# Patient Record
Sex: Female | Born: 1960 | Race: White | Hispanic: No | Marital: Single | Smoking: Former smoker
Health system: Southern US, Community
[De-identification: ages and names within clinical notes are randomized; demographics above are authoritative.]

## PROBLEM LIST (undated history)

## (undated) DIAGNOSIS — K219 Gastro-esophageal reflux disease without esophagitis: Secondary | ICD-10-CM

## (undated) DIAGNOSIS — Z87891 Personal history of nicotine dependence: Secondary | ICD-10-CM

## (undated) DIAGNOSIS — E079 Disorder of thyroid, unspecified: Secondary | ICD-10-CM

## (undated) HISTORY — PX: NECK SURGERY: SHX720

## (undated) HISTORY — PX: FOOT SURGERY: SHX648

## (undated) HISTORY — DX: Personal history of nicotine dependence: Z87.891

## (undated) HISTORY — DX: Gastro-esophageal reflux disease without esophagitis: K21.9

## (undated) HISTORY — PX: BLADDER SURGERY: SHX569

## (undated) HISTORY — PX: NASAL SINUS SURGERY: SHX719

## (undated) HISTORY — PX: ABDOMINAL HYSTERECTOMY: SHX81

---

## 2014-01-28 ENCOUNTER — Emergency Department (HOSPITAL_BASED_OUTPATIENT_CLINIC_OR_DEPARTMENT_OTHER)
Admission: EM | Admit: 2014-01-28 | Discharge: 2014-01-28 | Disposition: A | Discharge status: Home / Self Care | Payer: Self-pay | Attending: Emergency Medicine | Admitting: Emergency Medicine

## 2014-01-28 ENCOUNTER — Encounter (HOSPITAL_BASED_OUTPATIENT_CLINIC_OR_DEPARTMENT_OTHER): Payer: Self-pay | Admitting: Emergency Medicine

## 2014-01-28 DIAGNOSIS — J309 Allergic rhinitis, unspecified: Secondary | ICD-10-CM | POA: Insufficient documentation

## 2014-01-28 DIAGNOSIS — Z79899 Other long term (current) drug therapy: Secondary | ICD-10-CM | POA: Insufficient documentation

## 2014-01-28 DIAGNOSIS — J302 Other seasonal allergic rhinitis: Secondary | ICD-10-CM

## 2014-01-28 DIAGNOSIS — M791 Myalgia, unspecified site: Secondary | ICD-10-CM

## 2014-01-28 DIAGNOSIS — IMO0001 Reserved for inherently not codable concepts without codable children: Secondary | ICD-10-CM | POA: Insufficient documentation

## 2014-01-28 DIAGNOSIS — E039 Hypothyroidism, unspecified: Secondary | ICD-10-CM | POA: Insufficient documentation

## 2014-01-28 DIAGNOSIS — R52 Pain, unspecified: Secondary | ICD-10-CM | POA: Insufficient documentation

## 2014-01-28 DIAGNOSIS — Z880 Allergy status to penicillin: Secondary | ICD-10-CM | POA: Insufficient documentation

## 2014-01-28 DIAGNOSIS — R51 Headache: Secondary | ICD-10-CM | POA: Insufficient documentation

## 2014-01-28 DIAGNOSIS — Z87891 Personal history of nicotine dependence: Secondary | ICD-10-CM | POA: Insufficient documentation

## 2014-01-28 HISTORY — DX: Disorder of thyroid, unspecified: E07.9

## 2014-01-28 MED ORDER — CETIRIZINE HCL 10 MG PO TABS: 10.0000 mg | ORAL_TABLET | Freq: Every day | ORAL | Status: DC

## 2014-01-28 MED ORDER — CYCLOBENZAPRINE HCL 10 MG PO TABS: 10.0000 mg | ORAL_TABLET | Freq: Two times a day (BID) | ORAL | Status: DC | PRN

## 2014-01-28 MED ORDER — PREDNISONE 20 MG PO TABS: 40.0000 mg | ORAL_TABLET | Freq: Every day | ORAL | Status: DC

## 2014-01-28 MED ORDER — TRAZODONE HCL 50 MG PO TABS: 50.0000 mg | ORAL_TABLET | Freq: Every day | ORAL | Status: DC

## 2014-01-28 NOTE — ED Notes (Signed)
Patient reports that she developed general body aches with hot flashes and decreased sleep for the past several days. Complains of headache with same, reports that she has not had her thyroid meds for greater than a month with nausea.

## 2014-01-28 NOTE — Discharge Instructions (Signed)
Take Flexeril and prednisone as directed for your pain. Follow up with a primary care provider from the resource guide. Take zyrtec for your allergies. Refer to attached documents for more information.    Emergency Department Resource Guide 1) Find a Doctor and Pay Out of Pocket Although you won't have to find out who is covered by your insurance plan, it is a good idea to ask around and get recommendations. You will then need to call the office and see if the doctor you have chosen will accept you as a new patient and what types of options they offer for patients who are self-pay. Some doctors offer discounts or will set up payment plans for their patients who do not have insurance, but you will need to ask so you aren't surprised when you get to your appointment.  2) Contact Your Local Health Department Not all health departments have doctors that can see patients for sick visits, but many do, so it is worth a call to see if yours does. If you don't know where your local health department is, you can check in your phone book. The CDC also has a tool to help you locate your state's health department, and many state websites also have listings of all of their local health departments.  3) Find a Walk-in Clinic If your illness is not likely to be very severe or complicated, you may want to try a walk in clinic. These are popping up all over the country in pharmacies, drugstores, and shopping centers. They're usually staffed by nurse practitioners or physician assistants that have been trained to treat common illnesses and complaints. They're usually fairly quick and inexpensive. However, if you have serious medical issues or chronic medical problems, these are probably not your best option.  No Primary Care Doctor: - Call Health Connect at  [phone] - they can help you locate a primary care doctor that  accepts your insurance, provides certain services, etc. - Physician Referral Service-  [phone]  Chronic Pain Problems: Organization         Address  Phone   Notes  [facility]  [phone] Patients need to be referred by their primary care doctor.   Medication Assistance: Organization         Address  Phone   Notes  [facility] Medication [facility] [address] [place] [place], [place] [address] [phone] --Must be a resident of [facility] -- Must have NO insurance coverage whatsoever (no Medicaid/ Medicare, etc.) -- The pt. MUST have a primary care doctor that directs their care regularly and follows them in the community   MedAssist  [phone]   [employer]  [phone]    Agencies that provide inexpensive medical care: Organization         Address  Phone   Notes  [facility]  [phone]   [facility]    [phone]   [facility] [address] [place], [place] [address] [phone]   [facility] [address] [place] [place] [phone]   Planned Parenthood    [phone]   [facility]    [phone]   [facility]  [address], [place] Phone:  [phone], Fax:  [phone] Hours of Operation:  9 am - 6 pm, M-F.  Also accepts Medicaid/Medicare and self-pay.  [facility]  [place], [address], [place] Phone: [phone], Fax: [phone]. Hours of Operation:  8:30 am - 5:30 pm, M-F.  Also accepts Medicaid and self-pay.  [facility] [address], [place] Phone: [phone]   [place], [place], [place] [phone], Ext. [phone] Mondays & Thursdays: 7-9 AM.  First 15 patients are seen on a first come, first serve basis.    [place] Providers:  Organization         Address  Phone   Notes  [facility] [address],  [place] [phone] Also accepts self-pay patients.  [facility] [address], [place]  [phone]   [place], [address], [place] [phone]   [facility] [address] [place] [phone]   Lucianne Lei [address], [place]   [phone] Only accepts [place] Access [place] patients after they have their name applied to their card.   Self-Pay (no insurance) in [facility]:  Organization         Address  Phone   Notes  Sickle Cell Patients, [facility] Internal Medicine [place] [phone]   [facility] [place] [phone]   [facility] [place]  [place], [place], [place] [phone]   [facility]/Dr. Osei-Bonsu  [address], [place] or [address], [place] [phone] Phone number for both [place] and [place] locations is the same.  [facility] [address], [place] [phone]   [facility] [address] [place] or [address] [phone]   [facility] [address] [place] [phone], phone; [phone], fax Sees patients 1st and 3rd Saturday of every month.  Must not qualify for public or private insurance (i.e. Medicaid, Medicare, [place] Health Choice, Veterans' Benefits)  Household income should be no more than 200% of the poverty level The clinic cannot treat you if you are pregnant or think you are pregnant  Sexually transmitted diseases are not treated at the clinic.    Dental Care: Organization         Address  Phone  Notes  [facility] Department of [facility] [place] [phone] Accepts children up to age 50 who are enrolled in  [place] or [place]; pregnant women with a Medicaid card; and children who have applied for Medicaid or [place] Health Choice, but were declined, whose parents can pay a reduced fee at time of service.  [facility] Department of [facility]  [address], [place] [phone] Accepts children up to age 26 who are enrolled in [place] or [place]; pregnant women with a Medicaid card; and children who have applied for Medicaid or [place] Health Choice, but were declined, whose parents can pay a reduced fee at time of service.  [place] [place] [facility] Access PROGRAM  [place] [phone] Patients are seen by appointment only. Walk-ins are not accepted. [place] will see patients 70 years of age and older. Monday - Tuesday (8am-5pm) Most Wednesdays (8:30-5pm) $30 per visit, cash only  [place] Adult  Dental Access PROGRAM  [address], [facility] [phone] Patients are seen by appointment only. Walk-ins are not accepted. [place] will see patients 42 years of age and older. One Wednesday Evening (Monthly: Volunteer Based).  $30 per visit, cash only  [place]  [phone] for adults; Children under age 54, call Graduate Pediatric Dentistry at [phone]. Children aged 65-14, please call [phone] to request a pediatric application.  Dental services are provided in all areas of dental care including fillings, crowns and bridges, complete and partial dentures, implants, gum treatment, root canals, and extractions. Preventive care is also provided. Treatment is provided to both adults and children. Patients are selected via a lottery and there is often a waiting list.   [facility] [address] [place]  [phone] [email or web address]   [facility] [address] [place], [place] [phone], Ext. [phone] Second and Fourth Thursday of each month, opens at 6:30  AM; Clinic ends at 9 AM.  Patients are seen on a first-come first-served basis, and a limited number are seen during each clinic.   [facility]  [address] Hillard Danker [place], [place] [phone]   Eligibility Requirements You must have lived in [place], [place], or [place] counties for at least the last three months.   You cannot be eligible for state or federal sponsored [employer], including [employer], [place], or [employer].   You generally cannot be eligible for healthcare insurance through your employer.    How to apply: Eligibility screenings are held every Tuesday and Wednesday afternoon from 1:00 pm until 4:00 pm. You do not need an appointment for the interview!  [facility] [address] [place], [place]   [place]  [place] Department  [place]  [phone]    Behavioral Health Resources in the Community: Intensive Outpatient Programs Organization         Address  Phone  Notes  [place]. [address], [place], [place] [phone]   [facility] Outpatient [address], [place], [place]   ADS: [facility] [address] [place], [place]   [place] [address]  [place], [place] or [phone]   Substance Abuse Resources Organization         Address  Phone  Notes  Al[facility] and Drug Services  [phone]   [place]  [phone]   [facility] [place]   Chinita Pester  [phone]   Residential & Outpatient Substance Abuse Program  [phone]   Psychological Services Organization         Address  Phone  Notes  [facility] [place]  [place]  [phone]   [place] [address], [place] [phone] or  [phone]    Mobile Crisis Teams Organization         Address  Phone  Notes  [facility], Mobile Crisis Care Unit  [phone]   [facility]  [address] [place], [place]   [place] Levels [address] [place] [phone]    Self-Help/Support Groups Organization         Address  Phone             Notes  Mental  [facility] [place] - variety of support groups  [place] Call for more information  Narcotics Anonymous (NA), [facility] [address], [employer] [place]  2 meetings at this location   Special educational needs teacher         Address  Phone  Notes  [facility] Residential Treatment [place],    [place]  [phone]   [facility]  [address] [place] [address]270, [place], [place]   [place], [place] [phone] Admissions: 8am-3pm M-F  Incentives Substance [place] [address],    [place], [place] [phone]   [facility] [address] [place], [place], [place]   [facility] [place] [facility] [address]  [place], [place]   [facility] Programs - Intensive Outpatient [address], [place], [place]   [facility] [place].,  [place], [place] [phone] or [phone]   [facility]idential Treatment Services (RTS) [address] [place], [place] Accepts Medicaid  [facility] [place] [address]  [place] [phone] Substance Abuse/Addiction Treatment   [facility] Organization         Address  Phone  Notes  [facility]  [phone]   Domenic Schwab, PhD [address] [place], [place]   [phone] or [phone]   [facility], [place] [phone]   [facility] [address]  [place], [place] [phone] Insurance/Medicaid/sponsorship through [facility] and Families [address]                                    [place], [place] [phone] [place] [address], [place] [phone]    Dr. Adele Schilder  [phone]   [facility] Dept. 1) [address], [place] 2) [place] 3)  [place] [address], [place] [phone]  [phone]   [place] [phone] or [phone] (After Hours)

## 2014-01-28 NOTE — ED Provider Notes (Signed)
Medical screening examination/treatment/procedure(s) were performed by non-physician practitioner and as supervising physician I was immediately available for consultation/collaboration.   EKG Interpretation None        Graceann Boileau, MD 01/28/14 1521 

## 2014-01-28 NOTE — ED Notes (Signed)
rx x 4 given for zyrtec, flexeril,prednisone, and trazodone- pt d/c with friend

## 2014-01-28 NOTE — ED Provider Notes (Signed)
CSN: [id number]     Arrival date & time 01/28/14  1028 History   First MD Initiated Contact with Patient 01/28/14 1205     Chief Complaint  Patient presents with  . Generalized Body Aches     (Consider location/radiation/quality/duration/timing/severity/associated sxs/prior Treatment) HPI Comments: Patient is a 53 year old female with a past medical history of hypothyroidism who presents with a 1 month history of generalized body aches and difficulty sleeping. Symptoms started gradually and progressively worsened since the onset. Patient reports moving here recently from [place], [place] to live in a group home due to multiple suicide attempts after her husband died 1 year ago. Patient reports she has not had her levothyroxine in 1 month and has not followed up with a PCP since being in [place]. No aggravating/alleviating factor. No other associated symptoms.    Past Medical History  Diagnosis Date  . Thyroid disease    History reviewed. No pertinent past surgical history. No family history on file. History  Substance Use Topics  . Smoking status: Former Smoker    Quit date: 01/23/2014  . Smokeless tobacco: Not on file  . Alcohol Use: Not on file   OB History   Grav Para Term Preterm Abortions TAB SAB Ect Mult Living                 Review of Systems  Constitutional: Negative for fever, chills and fatigue.  HENT: Negative for trouble swallowing.   Eyes: Negative for visual disturbance.  Respiratory: Negative for shortness of breath.   Cardiovascular: Negative for chest pain and palpitations.  Gastrointestinal: Negative for nausea, vomiting, abdominal pain and diarrhea.  Genitourinary: Negative for dysuria and difficulty urinating.  Musculoskeletal: Positive for myalgias. Negative for arthralgias and neck pain.  Skin: Negative for color change.  Neurological: Positive for headaches. Negative for dizziness and weakness.  Psychiatric/Behavioral: Negative for dysphoric mood.       Allergies  Bactrim and Penicillins  Home Medications   Prior to Admission medications   Medication Sig Start Date End Date Taking? Authorizing Provider  levothyroxine (SYNTHROID, LEVOTHROID) 137 MCG tablet Take 137 mcg by mouth daily before breakfast.    Historical Provider, MD   BP 164/103  Pulse 86  Temp(Src) 98 F (36.7 C) (Oral)  Resp 18  SpO2 99% Physical Exam  Nursing note and vitals reviewed. Constitutional: She is oriented to person, place, and time. She appears well-developed and well-nourished. No distress.  HENT:  Head: Normocephalic and atraumatic.  Eyes: Conjunctivae and EOM are normal.  Neck: Normal range of motion.  Cardiovascular: Normal rate and regular rhythm.  Exam reveals no gallop and no friction rub.   No murmur heard. Pulmonary/Chest: Effort normal and breath sounds normal. She has no wheezes. She has no rales. She exhibits no tenderness.  Abdominal: Soft. She exhibits no distension. There is no tenderness. There is no rebound and no guarding.  Musculoskeletal: Normal range of motion.  Neurological: She is alert and oriented to person, place, and time. Coordination normal.  Speech is goal-oriented. Moves limbs without ataxia.   Skin: Skin is warm and dry.  Psychiatric: She has a normal mood and affect. Her behavior is normal.    ED Course  Procedures (including critical care time) Labs Review Labs Reviewed - No data to display  Imaging Review No results found.   EKG Interpretation None      MDM   Final diagnoses:  Myalgia  Seasonal allergies    12:19 PM Patient  has multiple chronic issues that need to be follow by a PCP. Patient has no acute complaints and has stable vitals and is afebrile. Patient will be discharged with zyrtec, flexeril, and prednisone. Patient will have a resource guide for PCP follow up. No further evaluation needed at this time.    Emilia Beck, PA-C 01/28/14 1357

## 2014-02-08 ENCOUNTER — Encounter: Payer: Self-pay | Admitting: Internal Medicine

## 2014-02-08 ENCOUNTER — Ambulatory Visit: Payer: Self-pay | Attending: Internal Medicine | Admitting: Internal Medicine

## 2014-02-08 VITALS — BP 137/91 | HR 72 | Temp 97.8°F | Resp 16 | Wt 226.8 lb

## 2014-02-08 DIAGNOSIS — E039 Hypothyroidism, unspecified: Secondary | ICD-10-CM

## 2014-02-08 DIAGNOSIS — Z7952 Long term (current) use of systemic steroids: Secondary | ICD-10-CM | POA: Insufficient documentation

## 2014-02-08 DIAGNOSIS — F17200 Nicotine dependence, unspecified, uncomplicated: Secondary | ICD-10-CM | POA: Insufficient documentation

## 2014-02-08 DIAGNOSIS — Z139 Encounter for screening, unspecified: Secondary | ICD-10-CM

## 2014-02-08 DIAGNOSIS — IMO0001 Reserved for inherently not codable concepts without codable children: Secondary | ICD-10-CM

## 2014-02-08 DIAGNOSIS — R030 Elevated blood-pressure reading, without diagnosis of hypertension: Secondary | ICD-10-CM | POA: Insufficient documentation

## 2014-02-08 DIAGNOSIS — F1910 Other psychoactive substance abuse, uncomplicated: Secondary | ICD-10-CM | POA: Insufficient documentation

## 2014-02-08 DIAGNOSIS — Z720 Tobacco use: Secondary | ICD-10-CM

## 2014-02-08 DIAGNOSIS — R0981 Nasal congestion: Secondary | ICD-10-CM | POA: Insufficient documentation

## 2014-02-08 LAB — CBC WITH DIFFERENTIAL/PLATELET
Basophils Absolute: 0 10*3/uL (ref 0.0–0.1)
Basophils Relative: 0 % (ref 0–1)
EOS PCT: 1 % (ref 0–5)
Eosinophils Absolute: 0.1 10*3/uL (ref 0.0–0.7)
HCT: 40.1 % (ref 36.0–46.0)
Hemoglobin: 13.5 g/dL (ref 12.0–15.0)
Lymphocytes Relative: 33 % (ref 12–46)
Lymphs Abs: 2.8 10*3/uL (ref 0.7–4.0)
MCH: 30.7 pg (ref 26.0–34.0)
MCHC: 33.7 g/dL (ref 30.0–36.0)
MCV: 91.1 fL (ref 78.0–100.0)
Monocytes Absolute: 0.6 10*3/uL (ref 0.1–1.0)
Monocytes Relative: 7 % (ref 3–12)
NEUTROS ABS: 5 10*3/uL (ref 1.7–7.7)
Neutrophils Relative %: 59 % (ref 43–77)
Platelets: 224 10*3/uL (ref 150–400)
RBC: 4.4 MIL/uL (ref 3.87–5.11)
RDW: 15.4 % (ref 11.5–15.5)
WBC: 8.4 10*3/uL (ref 4.0–10.5)

## 2014-02-08 LAB — LIPID PANEL
CHOL/HDL RATIO: 2.8 ratio
CHOLESTEROL: 265 mg/dL — AB (ref 0–200)
HDL: 94 mg/dL (ref >39)
LDL Cholesterol: 136 mg/dL — ABNORMAL HIGH (ref 0–99)
TRIGLYCERIDES: 174 mg/dL — AB (ref <150)
VLDL: 35 mg/dL (ref 0–40)

## 2014-02-08 LAB — COMPLETE METABOLIC PANEL WITH GFR
ALT: 15 U/L (ref 0–35)
AST: 16 U/L (ref 0–37)
Albumin: 4.2 g/dL (ref 3.5–5.2)
Alkaline Phosphatase: 60 U/L (ref 39–117)
BUN: 21 mg/dL (ref 6–23)
CALCIUM: 9.5 mg/dL (ref 8.4–10.5)
CHLORIDE: 99 meq/L (ref 96–112)
CO2: 25 mEq/L (ref 19–32)
CREATININE: 1.12 mg/dL — AB (ref 0.50–1.10)
GFR, Est African American: 64 mL/min
GFR, Est Non African American: 56 mL/min — ABNORMAL LOW
Glucose, Bld: 77 mg/dL (ref 70–99)
Potassium: 4.6 mEq/L (ref 3.5–5.3)
Sodium: 138 mEq/L (ref 135–145)
Total Bilirubin: 0.4 mg/dL (ref 0.2–1.2)
Total Protein: 7 g/dL (ref 6.0–8.3)

## 2014-02-08 LAB — HEMOGLOBIN A1C
HEMOGLOBIN A1C: 5.7 % — AB (ref <5.7)
Mean Plasma Glucose: 117 mg/dL — ABNORMAL HIGH (ref <117)

## 2014-02-08 LAB — TSH: TSH: 160.668 u[IU]/mL — ABNORMAL HIGH (ref 0.350–4.500)

## 2014-02-08 MED ORDER — CETIRIZINE HCL 10 MG PO TABS: 10.0000 mg | ORAL_TABLET | Freq: Every day | ORAL | Status: DC

## 2014-02-08 MED ORDER — LEVOTHYROXINE SODIUM 137 MCG PO TABS: 137.0000 ug | ORAL_TABLET | Freq: Every day | ORAL | Status: DC

## 2014-02-08 MED ORDER — FLUTICASONE PROPIONATE 50 MCG/ACT NA SUSP: 2.0000 | Freq: Every day | NASAL | Status: DC

## 2014-02-08 NOTE — Patient Instructions (Addendum)
DASH Eating Plan DASH stands for "Dietary Approaches to Stop Hypertension." The DASH eating plan is a healthy eating plan that has been shown to reduce high blood pressure (hypertension). Additional health benefits may include reducing the risk of type 2 diabetes mellitus, heart disease, and stroke. The DASH eating plan may also help with weight loss. WHAT DO I NEED TO KNOW ABOUT THE DASH EATING PLAN? For the DASH eating plan, you will follow these general guidelines:  Choose foods with a percent daily value for sodium of less than 5% (as listed on the food label).  Use salt-free seasonings or herbs instead of table salt or sea salt.  Check with your health care provider or pharmacist before using salt substitutes.  Eat lower-sodium products, often labeled as "lower sodium" or "no salt added."  Eat fresh foods.  Eat more vegetables, fruits, and low-fat dairy products.  Choose whole grains. Look for the word "whole" as the first word in the ingredient list.  Choose fish and skinless chicken or turkey more often than red meat. Limit fish, poultry, and meat to 6 oz (170 g) each day.  Limit sweets, desserts, sugars, and sugary drinks.  Choose heart-healthy fats.  Limit cheese to 1 oz (28 g) per day.  Eat more home-cooked food and less restaurant, buffet, and fast food.  Limit fried foods.  Cook foods using methods other than frying.  Limit canned vegetables. If you do use them, rinse them well to decrease the sodium.  When eating at a restaurant, ask that your food be prepared with less salt, or no salt if possible. WHAT FOODS CAN I EAT? Seek help from a dietitian for individual calorie needs. Grains Whole grain or whole wheat bread. Brown rice. Whole grain or whole wheat pasta. Quinoa, bulgur, and whole grain cereals. Low-sodium cereals. Corn or whole wheat flour tortillas. Whole grain cornbread. Whole grain crackers. Low-sodium crackers. Vegetables Fresh or frozen vegetables  (raw, steamed, roasted, or grilled). Low-sodium or reduced-sodium tomato and vegetable juices. Low-sodium or reduced-sodium tomato sauce and paste. Low-sodium or reduced-sodium canned vegetables.  Fruits All fresh, canned (in natural juice), or frozen fruits. Meat and Other Protein Products Ground beef (85% or leaner), grass-fed beef, or beef trimmed of fat. Skinless chicken or turkey. Ground chicken or turkey. Pork trimmed of fat. All fish and seafood. Eggs. Dried beans, peas, or lentils. Unsalted nuts and seeds. Unsalted canned beans. Dairy Low-fat dairy products, such as skim or 1% milk, 2% or reduced-fat cheeses, low-fat ricotta or cottage cheese, or plain low-fat yogurt. Low-sodium or reduced-sodium cheeses. Fats and Oils Tub margarines without trans fats. Light or reduced-fat mayonnaise and salad dressings (reduced sodium). Avocado. Safflower, olive, or canola oils. Natural peanut or almond butter. Other Unsalted popcorn and pretzels. The items listed above may not be a complete list of recommended foods or beverages. Contact your dietitian for more options. WHAT FOODS ARE NOT RECOMMENDED? Grains White bread. White pasta. White rice. Refined cornbread. Bagels and croissants. Crackers that contain trans fat. Vegetables Creamed or fried vegetables. Vegetables in a cheese sauce. Regular canned vegetables. Regular canned tomato sauce and paste. Regular tomato and vegetable juices. Fruits Dried fruits. Canned fruit in light or heavy syrup. Fruit juice. Meat and Other Protein Products Fatty cuts of meat. Ribs, chicken wings, bacon, sausage, bologna, salami, chitterlings, fatback, hot dogs, bratwurst, and packaged luncheon meats. Salted nuts and seeds. Canned beans with salt. Dairy Whole or 2% milk, cream, half-and-half, and cream cheese. Whole-fat or sweetened yogurt. Full-fat   cheeses or blue cheese. Nondairy creamers and whipped toppings. Processed cheese, cheese spreads, or cheese  curds. Condiments Onion and garlic salt, seasoned salt, table salt, and sea salt. Canned and packaged gravies. Worcestershire sauce. Tartar sauce. Barbecue sauce. Teriyaki sauce. Soy sauce, including reduced sodium. Steak sauce. Fish sauce. Oyster sauce. Cocktail sauce. Horseradish. Ketchup and mustard. Meat flavorings and tenderizers. Bouillon cubes. Hot sauce. Tabasco sauce. Marinades. Taco seasonings. Relishes. Fats and Oils Butter, stick margarine, lard, shortening, ghee, and bacon fat. Coconut, palm kernel, or palm oils. Regular salad dressings. Other Pickles and olives. Salted popcorn and pretzels. The items listed above may not be a complete list of foods and beverages to avoid. Contact your dietitian for more information. WHERE CAN I FIND MORE INFORMATION? National Heart, Lung, and [employer] Institute: www.nhlbi.nih.gov/health/health-topics/topics/dash/ Document Released: 04/09/2011 Document Revised: 09/04/2013 Document Reviewed: 02/22/2013 ExitCare Patient Information 2015 [employer]. This information is not intended to replace advice given to you by your health care provider. Make sure you discuss any questions you have with your health care provider. Smoking Cessation Quitting smoking is important to your health and has many advantages. However, it is not always easy to quit since nicotine is a very addictive drug. Oftentimes, people try 3 times or more before being able to quit. This document explains the best ways for you to prepare to quit smoking. Quitting takes hard work and a lot of effort, but you can do it. ADVANTAGES OF QUITTING SMOKING  You will live longer, feel better, and live better.  Your body will feel the impact of quitting smoking almost immediately.  Within 20 minutes, blood pressure decreases. Your pulse returns to its normal level.  After 8 hours, carbon monoxide levels in the blood return to normal. Your oxygen level increases.  After 24 hours, the chance of  having a heart attack starts to decrease. Your breath, hair, and body stop smelling like smoke.  After 48 hours, damaged nerve endings begin to recover. Your sense of taste and smell improve.  After 72 hours, the body is virtually free of nicotine. Your bronchial tubes relax and breathing becomes easier.  After 2 to 12 weeks, lungs can hold more air. Exercise becomes easier and circulation improves.  The risk of having a heart attack, stroke, cancer, or lung disease is greatly reduced.  After 1 year, the risk of coronary heart disease is cut in half.  After 5 years, the risk of stroke falls to the same as a nonsmoker.  After 10 years, the risk of lung cancer is cut in half and the risk of other cancers decreases significantly.  After 15 years, the risk of coronary heart disease drops, usually to the level of a nonsmoker.  If you are pregnant, quitting smoking will improve your chances of having a healthy baby.  The people you live with, especially any children, will be healthier.  You will have extra money to spend on things other than cigarettes. QUESTIONS TO THINK ABOUT BEFORE ATTEMPTING TO QUIT You may want to talk about your answers with your health care provider.  Why do you want to quit?  If you tried to quit in the past, what helped and what did not?  What will be the most difficult situations for you after you quit? How will you plan to handle them?  Who can help you through the tough times? Your family? Friends? A health care provider?  What pleasures do you get from smoking? What ways can you still get pleasure   if you quit? Here are some questions to ask your health care provider:  How can you help me to be successful at quitting?  What medicine do you think would be best for me and how should I take it?  What should I do if I need more help?  What is smoking withdrawal like? How can I get information on withdrawal? GET READY  Set a quit date.  Change your  environment by getting rid of all cigarettes, ashtrays, matches, and lighters in your home, car, or work. Do not let people smoke in your home.  Review your past attempts to quit. Think about what worked and what did not. GET SUPPORT AND ENCOURAGEMENT You have a better chance of being successful if you have help. You can get support in many ways.  Tell your family, friends, and coworkers that you are going to quit and need their support. Ask them not to smoke around you.  Get individual, group, or telephone counseling and support. Programs are available at local hospitals and health centers. Call your local health department for information about programs in your area.  Spiritual beliefs and practices may help some smokers quit.  Download a "quit meter" on your computer to keep track of quit statistics, such as how long you have gone without smoking, cigarettes not smoked, and money saved.  Get a self-help book about quitting smoking and staying off tobacco. LEARN NEW SKILLS AND BEHAVIORS  Distract yourself from urges to smoke. Talk to someone, go for a walk, or occupy your time with a task.  Change your normal routine. Take a different route to work. Drink tea instead of coffee. Eat breakfast in a different place.  Reduce your stress. Take a hot bath, exercise, or read a book.  Plan something enjoyable to do every day. Reward yourself for not smoking.  Explore interactive web-based programs that specialize in helping you quit. GET MEDICINE AND USE IT CORRECTLY Medicines can help you stop smoking and decrease the urge to smoke. Combining medicine with the above behavioral methods and support can greatly increase your chances of successfully quitting smoking.  Nicotine replacement therapy helps deliver nicotine to your body without the negative effects and risks of smoking. Nicotine replacement therapy includes nicotine gum, lozenges, inhalers, nasal sprays, and skin patches. Some may be  available over-the-counter and others require a prescription.  Antidepressant medicine helps people abstain from smoking, but how this works is unknown. This medicine is available by prescription.  Nicotinic receptor partial agonist medicine simulates the effect of nicotine in your brain. This medicine is available by prescription. Ask your health care provider for advice about which medicines to use and how to use them based on your health history. Your health care provider will tell you what side effects to look out for if you choose to be on a medicine or therapy. Carefully read the information on the package. Do not use any other product containing nicotine while using a nicotine replacement product.  RELAPSE OR DIFFICULT SITUATIONS Most relapses occur within the first 3 months after quitting. Do not be discouraged if you start smoking again. Remember, most people try several times before finally quitting. You may have symptoms of withdrawal because your body is used to nicotine. You may crave cigarettes, be irritable, feel very hungry, cough often, get headaches, or have difficulty concentrating. The withdrawal symptoms are only temporary. They are strongest when you first quit, but they will go away within 10-14 days. To reduce the   chances of relapse, try to:  Avoid drinking alcohol. Drinking lowers your chances of successfully quitting.  Reduce the amount of caffeine you consume. Once you quit smoking, the amount of caffeine in your body increases and can give you symptoms, such as a rapid heartbeat, sweating, and anxiety.  Avoid smokers because they can make you want to smoke.  Do not let weight gain distract you. Many smokers will gain weight when they quit, usually less than 10 pounds. Eat a healthy diet and stay active. You can always lose the weight gained after you quit.  Find ways to improve your mood other than smoking. FOR MORE INFORMATION  www.smokefree.gov  Document Released:  04/14/2001 Document Revised: 09/04/2013 Document Reviewed: 07/30/2011 ExitCare Patient Information 2015 [employer]. This information is not intended to replace advice given to you by your health care provider. Make sure you discuss any questions you have with your health care provider.  

## 2014-02-08 NOTE — Progress Notes (Signed)
Patient here to establish care Has history of thyroid disease Patient declined flu vaccine

## 2014-02-08 NOTE — Progress Notes (Signed)
Patient Demographics  Annette Boyer, is a 53 y.o. female  [id number]  [id number]  DOB - 06/18/1960  CC:  Chief Complaint  Patient presents with  . Establish Care       HPI: Annette Boyer is a 53 y.o. female here today to establish medical care.She has history of hypothyroidism, she also recently went to the emergency room with symptoms of myalgia difficulty sleeping, she recently moved from [place] and is currently lives in a group home, she's requesting refill on medication, as per patient she has been off her thyroid medication for the last one month does report she has gained weight, she also has lot of allergies and takes Zyrtec? Was prescribed her prednisone for her sinus issues in the past, patient also has history of polysubstance abuse, history of neck surgery in the past, patient has not had any mammogram done recently has not seen GYN. Patient has No headache, No chest pain, No abdominal pain - No Nausea, No new weakness tingling or numbness, No Cough - SOB.  Allergies  Allergen Reactions  . Bactrim [Sulfamethoxazole-Tmp Ds] Hives  . Penicillins Hives   Past Medical History  Diagnosis Date  . Thyroid disease    Current Outpatient Prescriptions on File Prior to Visit  Medication Sig Dispense Refill  . predniSONE (DELTASONE) 20 MG tablet Take 2 tablets (40 mg total) by mouth daily.  20 tablet  0  . traZODone (DESYREL) 50 MG tablet Take 1 tablet (50 mg total) by mouth at bedtime.  10 tablet  0  . cyclobenzaprine (FLEXERIL) 10 MG tablet Take 1 tablet (10 mg total) by mouth 2 (two) times daily as needed for muscle spasms.  20 tablet  0   No current facility-administered medications on file prior to visit.   History reviewed. No pertinent family history. History   Social History  . Marital Status: Single    Spouse Name: N/A    Number of Children: N/A  . Years of Education: N/A   Occupational History  . Not on file.   Social History Main  Topics  . Smoking status: Heavy Tobacco Smoker -- 0.50 packs/day for 7 years    Last Attempt to Quit: 01/23/2014  . Smokeless tobacco: Not on file  . Alcohol Use: No  . Drug Use: No     Comment: last use was 3 months ago   . Sexual Activity: Not on file   Other Topics Concern  . Not on file   Social History Narrative  . No narrative on file    Review of Systems: Constitutional: Negative for fever, chills, diaphoresis, activity change, appetite change and fatigue. HENT: Negative for ear pain, nosebleeds, congestion, facial swelling, rhinorrhea, neck pain, neck stiffness and ear discharge.  Eyes: Negative for pain, discharge, redness, itching and visual disturbance. Respiratory: Negative for cough, choking, chest tightness, shortness of breath, wheezing and stridor.  Cardiovascular: Negative for chest pain, palpitations and leg swelling. Gastrointestinal: Negative for abdominal distention. Genitourinary: Negative for dysuria, urgency, frequency, hematuria, flank pain, decreased urine volume, difficulty urinating and dyspareunia.  Musculoskeletal: Negative for back pain, joint swelling, arthralgia and gait problem. Neurological: Negative for dizziness, tremors, seizures, syncope, facial asymmetry, speech difficulty, weakness, light-headedness, numbness and headaches.  Hematological: Negative for adenopathy. Does not bruise/bleed easily. Psychiatric/Behavioral: Negative for hallucinations, behavioral problems, confusion, dysphoric mood, decreased concentration and agitation.    Objective:   Filed Vitals:   02/08/14 1041  BP: 137/91  Pulse: 72  Temp: 97.8 F (  36.6 C)  Resp: 16    Physical Exam: Constitutional: Patient appears well-developed and well-nourished. No distress. HENT: Normocephalic, atraumatic, External right and left ear normal. Nasal congestion no sinus tenderness. Eyes: Conjunctivae and EOM are normal. PERRLA, no scleral icterus. Neck: Normal ROM. Neck supple. No  JVD. No tracheal deviation. No thyromegaly. Old surgical scar  CVS: RRR, S1/S2 +, no murmurs, no gallops, no carotid bruit.  Pulmonary: Effort and breath sounds normal, no stridor, rhonchi, wheezes, rales.  Abdominal: Soft. BS +, no distension, tenderness, rebound or guarding.  Musculoskeletal: Normal range of motion. No edema and no tenderness.  Neuro: Alert. Normal reflexes, muscle tone coordination. No cranial nerve deficit. Skin: Skin is warm and dry. No rash noted. Not diaphoretic. No erythema. No pallor. Psychiatric: Normal mood and affect. Behavior, judgment, thought content normal.  No results found for this basename: WBC,  HGB,  HCT,  MCV,  PLT   No results found for this basename: CREATININE,  BUN,  NA,  K,  CL,  CO2    No results found for this basename: HGBA1C   Lipid Panel  No results found for this basename: chol,  trig,  hdl,  cholhdl,  vldl,  ldlcalc       Assessment and plan:   1. Hypothyroidism, unspecified hypothyroidism type Resume back on thyroid medication will check TSH level. - levothyroxine (SYNTHROID, LEVOTHROID) 137 MCG tablet; Take 1 tablet (137 mcg total) by mouth daily before breakfast.  Dispense: 30 tablet; Refill: 2  2. Elevated BP Advised patient for DASH diet   3. Smoking Advised patient to quit smoking  4. Screening  - Ambulatory referral to Gynecology - CBC with Differential - COMPLETE METABOLIC PANEL WITH GFR - TSH - Lipid panel - Vit D  25 hydroxy (rtn osteoporosis monitoring) - Hemoglobin A1c  5. Nasal congestion  - cetirizine (ZYRTEC ALLERGY) 10 MG tablet; Take 1 tablet (10 mg total) by mouth daily.  Dispense: 30 tablet; Refill: 1 - fluticasone (FLONASE) 50 MCG/ACT nasal spray; Place 2 sprays into both nostrils daily.  Dispense: 16 g; Refill: 6     Health Maintenance  -Pap Smear: referred to GYN -Mammogram: ordered  - Return in about 3 months (around 05/11/2014) for hypothyroid.   Doris CheadleADVANI, Kristal Perl, MD

## 2014-02-09 LAB — VITAMIN D 25 HYDROXY (VIT D DEFICIENCY, FRACTURES): Vit D, 25-Hydroxy: 20 ng/mL — ABNORMAL LOW (ref 30–89)

## 2014-02-13 ENCOUNTER — Telehealth: Payer: Self-pay | Admitting: Internal Medicine

## 2014-02-13 ENCOUNTER — Ambulatory Visit: Payer: Self-pay | Attending: Internal Medicine

## 2014-02-13 NOTE — Telephone Encounter (Signed)
Patient needs to know Lab test results. Please follow up with Patient.

## 2014-02-20 ENCOUNTER — Other Ambulatory Visit: Payer: Self-pay

## 2014-02-20 DIAGNOSIS — E039 Hypothyroidism, unspecified: Secondary | ICD-10-CM

## 2014-02-20 MED ORDER — LEVOTHYROXINE SODIUM 137 MCG PO TABS: 137.0000 ug | ORAL_TABLET | Freq: Every day | ORAL | Status: DC

## 2014-02-23 NOTE — Telephone Encounter (Signed)
Patient not available Left message on voice mail to return our call 

## 2014-02-28 ENCOUNTER — Telehealth: Payer: Self-pay

## 2014-02-28 MED ORDER — VITAMIN D (ERGOCALCIFEROL) 1.25 MG (50000 UNIT) PO CAPS: 50000.0000 [IU] | ORAL_CAPSULE | ORAL | Status: DC

## 2014-02-28 NOTE — Telephone Encounter (Signed)
Patient not available Left message with case manager to have her return our call

## 2014-02-28 NOTE — Telephone Encounter (Signed)
Message copied by Lestine MountJUAREZ, Attikus Bartoszek L on Wed Feb 28, 2014 11:46 AM ------      Message from: Doris CheadleADVANI, DEEPAK      Created: Fri Feb 09, 2014 10:19 AM       Blood work reviewed, noticed low vitamin D, call patient advise to start ergocalciferol 50,000 units once a week for the duration of  12 weeks.      Also her TSH is elevated since she was off her thyroid medication for more than a month, she was given a prescription for levothyroxine, advise patient to take the medication regularly we'll repeat TSH level on the next visit.      Also noticed her cholesterol is elevated, as well as her hemoglobin A1c is 5.7% advise patient for low carbohydrate and low fat diet ------

## 2014-03-06 ENCOUNTER — Telehealth: Payer: Self-pay | Admitting: Internal Medicine

## 2014-03-06 ENCOUNTER — Telehealth: Payer: Self-pay

## 2014-03-06 NOTE — Telephone Encounter (Signed)
Patient called to get her lab results from 10/8 appointment Results given to patient and she is aware she needs to pick up Prescription for vitamin D

## 2014-03-06 NOTE — Telephone Encounter (Signed)
Pt is requesting results.  

## 2014-04-12 ENCOUNTER — Ambulatory Visit (HOSPITAL_BASED_OUTPATIENT_CLINIC_OR_DEPARTMENT_OTHER): Payer: Self-pay | Admitting: *Deleted

## 2014-04-12 ENCOUNTER — Ambulatory Visit: Payer: Self-pay | Attending: Internal Medicine | Admitting: Internal Medicine

## 2014-04-12 ENCOUNTER — Encounter: Payer: Self-pay | Admitting: Internal Medicine

## 2014-04-12 VITALS — BP 126/89 | HR 92 | Temp 98.0°F | Resp 16 | Wt 227.8 lb

## 2014-04-12 DIAGNOSIS — F17200 Nicotine dependence, unspecified, uncomplicated: Secondary | ICD-10-CM

## 2014-04-12 DIAGNOSIS — Z720 Tobacco use: Secondary | ICD-10-CM

## 2014-04-12 DIAGNOSIS — G4700 Insomnia, unspecified: Secondary | ICD-10-CM | POA: Insufficient documentation

## 2014-04-12 DIAGNOSIS — Z23 Encounter for immunization: Secondary | ICD-10-CM

## 2014-04-12 DIAGNOSIS — M791 Myalgia: Secondary | ICD-10-CM

## 2014-04-12 DIAGNOSIS — Z299 Encounter for prophylactic measures, unspecified: Secondary | ICD-10-CM

## 2014-04-12 DIAGNOSIS — E039 Hypothyroidism, unspecified: Secondary | ICD-10-CM | POA: Insufficient documentation

## 2014-04-12 DIAGNOSIS — F17210 Nicotine dependence, cigarettes, uncomplicated: Secondary | ICD-10-CM | POA: Insufficient documentation

## 2014-04-12 DIAGNOSIS — Z418 Encounter for other procedures for purposes other than remedying health state: Secondary | ICD-10-CM

## 2014-04-12 DIAGNOSIS — M7918 Myalgia, other site: Secondary | ICD-10-CM

## 2014-04-12 DIAGNOSIS — R7301 Impaired fasting glucose: Secondary | ICD-10-CM | POA: Insufficient documentation

## 2014-04-12 DIAGNOSIS — E559 Vitamin D deficiency, unspecified: Secondary | ICD-10-CM | POA: Insufficient documentation

## 2014-04-12 LAB — TSH: TSH: 0.049 u[IU]/mL — ABNORMAL LOW (ref 0.350–4.500)

## 2014-04-12 MED ORDER — DULOXETINE HCL 20 MG PO CPEP: 20.0000 mg | ORAL_CAPSULE | Freq: Every day | ORAL | Status: DC

## 2014-04-12 NOTE — Patient Instructions (Addendum)
Smoking Cessation Quitting smoking is important to your health and has many advantages. However, it is not always easy to quit since nicotine is a very addictive drug. Oftentimes, people try 3 times or more before being able to quit. This document explains the best ways for you to prepare to quit smoking. Quitting takes hard work and a lot of effort, but you can do it. ADVANTAGES OF QUITTING SMOKING  You will live longer, feel better, and live better.  Your body will feel the impact of quitting smoking almost immediately.  Within 20 minutes, blood pressure decreases. Your pulse returns to its normal level.  After 8 hours, carbon monoxide levels in the blood return to normal. Your oxygen level increases.  After 24 hours, the chance of having a heart attack starts to decrease. Your breath, hair, and body stop smelling like smoke.  After 48 hours, damaged nerve endings begin to recover. Your sense of taste and smell improve.  After 72 hours, the body is virtually free of nicotine. Your bronchial tubes relax and breathing becomes easier.  After 2 to 12 weeks, lungs can hold more air. Exercise becomes easier and circulation improves.  The risk of having a heart attack, stroke, cancer, or lung disease is greatly reduced.  After 1 year, the risk of coronary heart disease is cut in half.  After 5 years, the risk of stroke falls to the same as a nonsmoker.  After 10 years, the risk of lung cancer is cut in half and the risk of other cancers decreases significantly.  After 15 years, the risk of coronary heart disease drops, usually to the level of a nonsmoker.  If you are pregnant, quitting smoking will improve your chances of having a healthy baby.  The people you live with, especially any children, will be healthier.  You will have extra money to spend on things other than cigarettes. QUESTIONS TO THINK ABOUT BEFORE ATTEMPTING TO QUIT You may want to talk about your answers with your  health care provider.  Why do you want to quit?  If you tried to quit in the past, what helped and what did not?  What will be the most difficult situations for you after you quit? How will you plan to handle them?  Who can help you through the tough times? Your family? Friends? A health care provider?  What pleasures do you get from smoking? What ways can you still get pleasure if you quit? Here are some questions to ask your health care provider:  How can you help me to be successful at quitting?  What medicine do you think would be best for me and how should I take it?  What should I do if I need more help?  What is smoking withdrawal like? How can I get information on withdrawal? GET READY  Set a quit date.  Change your environment by getting rid of all cigarettes, ashtrays, matches, and lighters in your home, car, or work. Do not let people smoke in your home.  Review your past attempts to quit. Think about what worked and what did not. GET SUPPORT AND ENCOURAGEMENT You have a better chance of being successful if you have help. You can get support in many ways.  Tell your family, friends, and coworkers that you are going to quit and need their support. Ask them not to smoke around you.  Get individual, group, or telephone counseling and support. Programs are available at local hospitals and health centers. Call   your local health department for information about programs in your area.  Spiritual beliefs and practices may help some smokers quit.  Download a "quit meter" on your computer to keep track of quit statistics, such as how long you have gone without smoking, cigarettes not smoked, and money saved.  Get a self-help book about quitting smoking and staying off tobacco. LEARN NEW SKILLS AND BEHAVIORS  Distract yourself from urges to smoke. Talk to someone, go for a walk, or occupy your time with a task.  Change your normal routine. Take a different route to work.  Drink tea instead of coffee. Eat breakfast in a different place.  Reduce your stress. Take a hot bath, exercise, or read a book.  Plan something enjoyable to do every day. Reward yourself for not smoking.  Explore interactive web-based programs that specialize in helping you quit. GET MEDICINE AND USE IT CORRECTLY Medicines can help you stop smoking and decrease the urge to smoke. Combining medicine with the above behavioral methods and support can greatly increase your chances of successfully quitting smoking.  Nicotine replacement therapy helps deliver nicotine to your body without the negative effects and risks of smoking. Nicotine replacement therapy includes nicotine gum, lozenges, inhalers, nasal sprays, and skin patches. Some may be available over-the-counter and others require a prescription.  Antidepressant medicine helps people abstain from smoking, but how this works is unknown. This medicine is available by prescription.  Nicotinic receptor partial agonist medicine simulates the effect of nicotine in your brain. This medicine is available by prescription. Ask your health care provider for advice about which medicines to use and how to use them based on your health history. Your health care provider will tell you what side effects to look out for if you choose to be on a medicine or therapy. Carefully read the information on the package. Do not use any other product containing nicotine while using a nicotine replacement product.  RELAPSE OR DIFFICULT SITUATIONS Most relapses occur within the first 3 months after quitting. Do not be discouraged if you start smoking again. Remember, most people try several times before finally quitting. You may have symptoms of withdrawal because your body is used to nicotine. You may crave cigarettes, be irritable, feel very hungry, cough often, get headaches, or have difficulty concentrating. The withdrawal symptoms are only temporary. They are strongest  when you first quit, but they will go away within 10-14 days. To reduce the chances of relapse, try to:  Avoid drinking alcohol. Drinking lowers your chances of successfully quitting.  Reduce the amount of caffeine you consume. Once you quit smoking, the amount of caffeine in your body increases and can give you symptoms, such as a rapid heartbeat, sweating, and anxiety.  Avoid smokers because they can make you want to smoke.  Do not let weight gain distract you. Many smokers will gain weight when they quit, usually less than 10 pounds. Eat a healthy diet and stay active. You can always lose the weight gained after you quit.  Find ways to improve your mood other than smoking. FOR MORE INFORMATION  www.smokefree.gov  Document Released: 04/14/2001 Document Revised: 09/04/2013 Document Reviewed: 07/30/2011 ExitCare Patient Information 2015 [employer]. This information is not intended to replace advice given to you by your health care provider. Make sure you discuss any questions you have with your health care provider. Diabetes Mellitus and Food It is important for you to manage your blood sugar (glucose) level. Your blood glucose level can be   greatly affected by what you eat. Eating healthier foods in the appropriate amounts throughout the day at about the same time each day will help you control your blood glucose level. It can also help slow or prevent worsening of your diabetes mellitus. Healthy eating may even help you improve the level of your blood pressure and reach or maintain a healthy weight.  HOW CAN FOOD AFFECT ME? Carbohydrates Carbohydrates affect your blood glucose level more than any other type of food. Your dietitian will help you determine how many carbohydrates to eat at each meal and teach you how to count carbohydrates. Counting carbohydrates is important to keep your blood glucose at a healthy level, especially if you are using insulin or taking certain medicines for  diabetes mellitus. Alcohol Alcohol can cause sudden decreases in blood glucose (hypoglycemia), especially if you use insulin or take certain medicines for diabetes mellitus. Hypoglycemia can be a life-threatening condition. Symptoms of hypoglycemia (sleepiness, dizziness, and disorientation) are similar to symptoms of having too much alcohol.  If your health care provider has given you approval to drink alcohol, do so in moderation and use the following guidelines:  Women should not have more than one drink per day, and men should not have more than two drinks per day. One drink is equal to:  12 oz of beer.  5 oz of wine.  1 oz of hard liquor.  Do not drink on an empty stomach.  Keep yourself hydrated. Have water, diet soda, or unsweetened iced tea.  Regular soda, juice, and other mixers might contain a lot of carbohydrates and should be counted. WHAT FOODS ARE NOT RECOMMENDED? As you make food choices, it is important to remember that all foods are not the same. Some foods have fewer nutrients per serving than other foods, even though they might have the same number of calories or carbohydrates. It is difficult to get your body what it needs when you eat foods with fewer nutrients. Examples of foods that you should avoid that are high in calories and carbohydrates but low in nutrients include:  Trans fats (most processed foods list trans fats on the Nutrition Facts label).  Regular soda.  Juice.  Candy.  Sweets, such as cake, pie, doughnuts, and cookies.  Fried foods. WHAT FOODS CAN I EAT? Have nutrient-rich foods, which will nourish your body and keep you healthy. The food you should eat also will depend on several factors, including:  The calories you need.  The medicines you take.  Your weight.  Your blood glucose level.  Your blood pressure level.  Your cholesterol level. You also should eat a variety of foods, including:  Protein, such as meat, poultry, fish,  tofu, nuts, and seeds (lean animal proteins are best).  Fruits.  Vegetables.  Dairy products, such as milk, cheese, and yogurt (low fat is best).  Breads, grains, pasta, cereal, rice, and beans.  Fats such as olive oil, trans fat-free margarine, canola oil, avocado, and olives. DOES EVERYONE WITH DIABETES MELLITUS HAVE THE SAME MEAL PLAN? Because every person with diabetes mellitus is different, there is not one meal plan that works for everyone. It is very important that you meet with a dietitian who will help you create a meal plan that is just right for you. Document Released: 01/15/2005 Document Revised: 04/25/2013 Document Reviewed: 03/17/2013 ExitCare Patient Information 2015 [employer]. This information is not intended to replace advice given to you by your health care provider. Make sure you discuss any questions   you have with your health care provider.  

## 2014-04-12 NOTE — Progress Notes (Signed)
Patient here for follow up on her chronic pain Complains of foot pain that burns all the way to her heal of her feet Has pain from her neck all the way down her body

## 2014-04-12 NOTE — Progress Notes (Signed)
MRN: [id number] Name: Annette Boyer  Sex: female Age: 53 y.o. DOB: [birth year]  Allergies: Bactrim and Penicillins  Chief Complaint  Patient presents with  . Follow-up    HPI: Patient is 53 y.o. female who has history of hypothyroidism comes today for followup, had a blood work done which was reviewed with the patient noticed impaired fasting glucose and lipid triglycerides, vitamin D deficiency, she reported to have chronic pain in her neck both legs feet occasional numbness tingling and feet, she does history of neck surgery done in the past, patient denies any fever chills any weakness, patient has been taking her thyroid medication. Patient will like to get a flu shot and pneumonia shot today. Patient is to smoke cigarettes, advised patient to quit smoking.her blood pressure is improved compared to last visit.patient is complaining of insomnia.  Past Medical History  Diagnosis Date  . Thyroid disease     Past Surgical History  Procedure Laterality Date  . Abdominal hysterectomy    . Nasal sinus surgery    . Bladder surgery    . Neck surgery      20 years ago       Medication List       This list is accurate as of: 04/12/14  1:04 PM.  Always use your most recent med list.               cetirizine 10 MG tablet  Commonly known as:  ZYRTEC ALLERGY  Take 1 tablet (10 mg total) by mouth daily.     cyclobenzaprine 10 MG tablet  Commonly known as:  FLEXERIL  Take 1 tablet (10 mg total) by mouth 2 (two) times daily as needed for muscle spasms.     DULoxetine 20 MG capsule  Commonly known as:  CYMBALTA  Take 1 capsule (20 mg total) by mouth daily.     fluticasone 50 MCG/ACT nasal spray  Commonly known as:  FLONASE  Place 2 sprays into both nostrils daily.     levothyroxine 137 MCG tablet  Commonly known as:  SYNTHROID, LEVOTHROID  Take 1 tablet (137 mcg total) by mouth daily before breakfast.     predniSONE 20 MG tablet  Commonly known as:  DELTASONE  Take  2 tablets (40 mg total) by mouth daily.     traZODone 50 MG tablet  Commonly known as:  DESYREL  Take 1 tablet (50 mg total) by mouth at bedtime.     Vitamin D (Ergocalciferol) 50000 UNITS Caps capsule  Commonly known as:  DRISDOL  Take 1 capsule (50,000 Units total) by mouth every 7 (seven) days.        Meds ordered this encounter  Medications  . DULoxetine (CYMBALTA) 20 MG capsule    Sig: Take 1 capsule (20 mg total) by mouth daily.    Dispense:  30 capsule    Refill:  3     There is no immunization history on file for this patient.  History reviewed. No pertinent family history.  History  Substance Use Topics  . Smoking status: Heavy Tobacco Smoker -- 0.50 packs/day for 7 years    Last Attempt to Quit: 01/23/2014  . Smokeless tobacco: Not on file  . Alcohol Use: No    Review of Systems   As noted in HPI  Filed Vitals:   04/12/14 1236  BP: 126/89  Pulse: 92  Temp: 98 F (36.7 C)  Resp: 16    Physical Exam  Physical Exam  Constitutional:  No distress.  Eyes: EOM are normal. Pupils are equal, round, and reactive to light.  Neck: Neck supple.  Cardiovascular: Normal rate and regular rhythm.   Pulmonary/Chest: Breath sounds normal. No respiratory distress. She has no wheezes. She has no rales.  Musculoskeletal: She exhibits no edema.  SLR negative equal strength both lower extremities.    CBC    Component Value Date/Time   WBC 8.4 02/08/2014 1118   RBC 4.40 02/08/2014 1118   HGB 13.5 02/08/2014 1118   HCT 40.1 02/08/2014 1118   PLT 224 02/08/2014 1118   MCV 91.1 02/08/2014 1118   LYMPHSABS 2.8 02/08/2014 1118   MONOABS 0.6 02/08/2014 1118   EOSABS 0.1 02/08/2014 1118   BASOSABS 0.0 02/08/2014 1118    CMP     Component Value Date/Time   NA 138 02/08/2014 1118   K 4.6 02/08/2014 1118   CL 99 02/08/2014 1118   CO2 25 02/08/2014 1118   GLUCOSE 77 02/08/2014 1118   BUN 21 02/08/2014 1118   CREATININE 1.12* 02/08/2014 1118   CALCIUM 9.5  02/08/2014 1118   PROT 7.0 02/08/2014 1118   ALBUMIN 4.2 02/08/2014 1118   AST 16 02/08/2014 1118   ALT 15 02/08/2014 1118   ALKPHOS 60 02/08/2014 1118   BILITOT 0.4 02/08/2014 1118   GFRNONAA 56* 02/08/2014 1118   GFRAA 64 02/08/2014 1118    Lab Results  Component Value Date/Time   CHOL 265* 02/08/2014 11:18 AM    No components found for: HGA1C  Lab Results  Component Value Date/Time   AST 16 02/08/2014 11:18 AM    Assessment and Plan  Hypothyroidism, unspecified hypothyroidism type - Plan:  Currently patient is on levothyroxine 137 mcg daily, recheck TSH level  Vitamin D deficiency Currently patient is taking vitamin D supplements 50,000 units once a week dosage.  Musculoskeletal pain - Plan: trial of DULoxetine (CYMBALTA) 20 MG capsule  Smoking Advised patient to quit smoking.  Needs flu shot Flu shot given today.  Need for prophylactic vaccination against Streptococcus pneumoniae (pneumococcus) Pneumovax given today.  IFG (impaired fasting glucose) Advise patient for low carbohydrate diet.  Insomnia Patient is advised for lifestyle modification, quit smoking, she can try over-the-counter melatonin  Health Maintenance  -Vaccinations:  Flu shot and Pneumovax today.  Return in about 3 months (around 07/12/2014).  Doris CheadleADVANI, Breezy Hertenstein, MD

## 2014-04-13 ENCOUNTER — Telehealth: Payer: Self-pay

## 2014-04-13 NOTE — Telephone Encounter (Signed)
Patient not available Left message on voice mail to return our call 

## 2014-04-13 NOTE — Telephone Encounter (Signed)
-----   Message from Doris Cheadleeepak Advani, MD sent at 04/13/2014 10:13 AM EST ----- Call and let the patient know that her thyroid test is abnormal, currently patient is taking 137 mcg of levothyroxine, advise patient to reduce the dose to 125 mcg daily, will repeat  TSH level on the next visit.

## 2014-04-25 ENCOUNTER — Other Ambulatory Visit: Payer: Self-pay

## 2014-04-25 DIAGNOSIS — E039 Hypothyroidism, unspecified: Secondary | ICD-10-CM

## 2014-04-25 MED ORDER — LEVOTHYROXINE SODIUM 137 MCG PO TABS: 137.0000 ug | ORAL_TABLET | Freq: Every day | ORAL | Status: DC

## 2014-06-04 ENCOUNTER — Other Ambulatory Visit: Payer: Self-pay | Admitting: Internal Medicine

## 2014-07-18 ENCOUNTER — Ambulatory Visit: Payer: Self-pay | Admitting: Internal Medicine

## 2014-08-01 ENCOUNTER — Ambulatory Visit (HOSPITAL_COMMUNITY)
Admission: RE | Admit: 2014-08-01 | Discharge: 2014-08-01 | Disposition: A | Discharge status: Home / Self Care | Payer: Self-pay | Source: Ambulatory Visit | Attending: Internal Medicine | Admitting: Internal Medicine

## 2014-08-01 ENCOUNTER — Encounter: Payer: Self-pay | Admitting: Internal Medicine

## 2014-08-01 ENCOUNTER — Ambulatory Visit: Payer: Self-pay | Attending: Internal Medicine | Admitting: Internal Medicine

## 2014-08-01 VITALS — BP 133/81 | HR 87 | Temp 98.5°F | Resp 16 | Wt 226.0 lb

## 2014-08-01 DIAGNOSIS — R7309 Other abnormal glucose: Secondary | ICD-10-CM

## 2014-08-01 DIAGNOSIS — M25761 Osteophyte, right knee: Secondary | ICD-10-CM | POA: Insufficient documentation

## 2014-08-01 DIAGNOSIS — M25562 Pain in left knee: Secondary | ICD-10-CM

## 2014-08-01 DIAGNOSIS — M7918 Myalgia, other site: Secondary | ICD-10-CM

## 2014-08-01 DIAGNOSIS — Z8261 Family history of arthritis: Secondary | ICD-10-CM

## 2014-08-01 DIAGNOSIS — E559 Vitamin D deficiency, unspecified: Secondary | ICD-10-CM

## 2014-08-01 DIAGNOSIS — Z720 Tobacco use: Secondary | ICD-10-CM

## 2014-08-01 DIAGNOSIS — M2550 Pain in unspecified joint: Secondary | ICD-10-CM

## 2014-08-01 DIAGNOSIS — IMO0001 Reserved for inherently not codable concepts without codable children: Secondary | ICD-10-CM

## 2014-08-01 DIAGNOSIS — M25561 Pain in right knee: Secondary | ICD-10-CM

## 2014-08-01 DIAGNOSIS — E039 Hypothyroidism, unspecified: Secondary | ICD-10-CM

## 2014-08-01 DIAGNOSIS — E079 Disorder of thyroid, unspecified: Secondary | ICD-10-CM | POA: Insufficient documentation

## 2014-08-01 DIAGNOSIS — R7303 Prediabetes: Secondary | ICD-10-CM

## 2014-08-01 DIAGNOSIS — F17200 Nicotine dependence, unspecified, uncomplicated: Secondary | ICD-10-CM

## 2014-08-01 DIAGNOSIS — M25762 Osteophyte, left knee: Secondary | ICD-10-CM | POA: Insufficient documentation

## 2014-08-01 DIAGNOSIS — R7301 Impaired fasting glucose: Secondary | ICD-10-CM | POA: Insufficient documentation

## 2014-08-01 DIAGNOSIS — M791 Myalgia: Secondary | ICD-10-CM

## 2014-08-01 LAB — COMPLETE METABOLIC PANEL WITH GFR
ALT: 14 U/L (ref 0–35)
AST: 14 U/L (ref 0–37)
Albumin: 4 g/dL (ref 3.5–5.2)
Alkaline Phosphatase: 84 U/L (ref 39–117)
BUN: 18 mg/dL (ref 6–23)
CALCIUM: 9.1 mg/dL (ref 8.4–10.5)
CO2: 23 meq/L (ref 19–32)
CREATININE: 1.07 mg/dL (ref 0.50–1.10)
Chloride: 108 mEq/L (ref 96–112)
GFR, EST AFRICAN AMERICAN: 68 mL/min
GFR, EST NON AFRICAN AMERICAN: 59 mL/min — AB
GLUCOSE: 95 mg/dL (ref 70–99)
Potassium: 4.6 mEq/L (ref 3.5–5.3)
Sodium: 139 mEq/L (ref 135–145)
Total Bilirubin: 0.3 mg/dL (ref 0.2–1.2)
Total Protein: 6.8 g/dL (ref 6.0–8.3)

## 2014-08-01 LAB — TSH: TSH: 0.02 u[IU]/mL — AB (ref 0.350–4.500)

## 2014-08-01 LAB — URIC ACID: URIC ACID, SERUM: 3.9 mg/dL (ref 2.4–7.0)

## 2014-08-01 LAB — RHEUMATOID FACTOR: Rhuematoid fact SerPl-aCnc: 28 IU/mL — ABNORMAL HIGH (ref <=14)

## 2014-08-01 MED ORDER — DULOXETINE HCL 30 MG PO CPEP: 30.0000 mg | ORAL_CAPSULE | Freq: Every day | ORAL | Status: DC

## 2014-08-01 NOTE — Progress Notes (Signed)
Patient complains of pain to her legs and feet Patient also concerned about a lump to her inner left elbow That causes her pain as well as a lump to the back of her right heel that Is painful and shoot up the back of her leg

## 2014-08-01 NOTE — Patient Instructions (Addendum)
Smoking Cessation Quitting smoking is important to your health and has many advantages. However, it is not always easy to quit since nicotine is a very addictive drug. Oftentimes, people try 3 times or more before being able to quit. This document explains the best ways for you to prepare to quit smoking. Quitting takes hard work and a lot of effort, but you can do it. ADVANTAGES OF QUITTING SMOKING  You will live longer, feel better, and live better.  Your body will feel the impact of quitting smoking almost immediately.  Within 20 minutes, blood pressure decreases. Your pulse returns to its normal level.  After 8 hours, carbon monoxide levels in the blood return to normal. Your oxygen level increases.  After 24 hours, the chance of having a heart attack starts to decrease. Your breath, hair, and body stop smelling like smoke.  After 48 hours, damaged nerve endings begin to recover. Your sense of taste and smell improve.  After 72 hours, the body is virtually free of nicotine. Your bronchial tubes relax and breathing becomes easier.  After 2 to 12 weeks, lungs can hold more air. Exercise becomes easier and circulation improves.  The risk of having a heart attack, stroke, cancer, or lung disease is greatly reduced.  After 1 year, the risk of coronary heart disease is cut in half.  After 5 years, the risk of stroke falls to the same as a nonsmoker.  After 10 years, the risk of lung cancer is cut in half and the risk of other cancers decreases significantly.  After 15 years, the risk of coronary heart disease drops, usually to the level of a nonsmoker.  If you are pregnant, quitting smoking will improve your chances of having a healthy baby.  The people you live with, especially any children, will be healthier.  You will have extra money to spend on things other than cigarettes. QUESTIONS TO THINK ABOUT BEFORE ATTEMPTING TO QUIT You may want to talk about your answers with your  health care provider.  Why do you want to quit?  If you tried to quit in the past, what helped and what did not?  What will be the most difficult situations for you after you quit? How will you plan to handle them?  Who can help you through the tough times? Your family? Friends? A health care provider?  What pleasures do you get from smoking? What ways can you still get pleasure if you quit? Here are some questions to ask your health care provider:  How can you help me to be successful at quitting?  What medicine do you think would be best for me and how should I take it?  What should I do if I need more help?  What is smoking withdrawal like? How can I get information on withdrawal? GET READY  Set a quit date.  Change your environment by getting rid of all cigarettes, ashtrays, matches, and lighters in your home, car, or work. Do not let people smoke in your home.  Review your past attempts to quit. Think about what worked and what did not. GET SUPPORT AND ENCOURAGEMENT You have a better chance of being successful if you have help. You can get support in many ways.  Tell your family, friends, and coworkers that you are going to quit and need their support. Ask them not to smoke around you.  Get individual, group, or telephone counseling and support. Programs are available at local hospitals and health centers. Call   your local health department for information about programs in your area.  Spiritual beliefs and practices may help some smokers quit.  Download a "quit meter" on your computer to keep track of quit statistics, such as how long you have gone without smoking, cigarettes not smoked, and money saved.  Get a self-help book about quitting smoking and staying off tobacco. LEARN NEW SKILLS AND BEHAVIORS  Distract yourself from urges to smoke. Talk to someone, go for a walk, or occupy your time with a task.  Change your normal routine. Take a different route to work.  Drink tea instead of coffee. Eat breakfast in a different place.  Reduce your stress. Take a hot bath, exercise, or read a book.  Plan something enjoyable to do every day. Reward yourself for not smoking.  Explore interactive web-based programs that specialize in helping you quit. GET MEDICINE AND USE IT CORRECTLY Medicines can help you stop smoking and decrease the urge to smoke. Combining medicine with the above behavioral methods and support can greatly increase your chances of successfully quitting smoking.  Nicotine replacement therapy helps deliver nicotine to your body without the negative effects and risks of smoking. Nicotine replacement therapy includes nicotine gum, lozenges, inhalers, nasal sprays, and skin patches. Some may be available over-the-counter and others require a prescription.  Antidepressant medicine helps people abstain from smoking, but how this works is unknown. This medicine is available by prescription.  Nicotinic receptor partial agonist medicine simulates the effect of nicotine in your brain. This medicine is available by prescription. Ask your health care provider for advice about which medicines to use and how to use them based on your health history. Your health care provider will tell you what side effects to look out for if you choose to be on a medicine or therapy. Carefully read the information on the package. Do not use any other product containing nicotine while using a nicotine replacement product.  RELAPSE OR DIFFICULT SITUATIONS Most relapses occur within the first 3 months after quitting. Do not be discouraged if you start smoking again. Remember, most people try several times before finally quitting. You may have symptoms of withdrawal because your body is used to nicotine. You may crave cigarettes, be irritable, feel very hungry, cough often, get headaches, or have difficulty concentrating. The withdrawal symptoms are only temporary. They are strongest  when you first quit, but they will go away within 10-14 days. To reduce the chances of relapse, try to:  Avoid drinking alcohol. Drinking lowers your chances of successfully quitting.  Reduce the amount of caffeine you consume. Once you quit smoking, the amount of caffeine in your body increases and can give you symptoms, such as a rapid heartbeat, sweating, and anxiety.  Avoid smokers because they can make you want to smoke.  Do not let weight gain distract you. Many smokers will gain weight when they quit, usually less than 10 pounds. Eat a healthy diet and stay active. You can always lose the weight gained after you quit.  Find ways to improve your mood other than smoking. FOR MORE INFORMATION  www.smokefree.gov  Document Released: 04/14/2001 Document Revised: 09/04/2013 Document Reviewed: 07/30/2011 ExitCare Patient Information 2015 [employer]. This information is not intended to replace advice given to you by your health care provider. Make sure you discuss any questions you have with your health care provider. Diabetes Mellitus and Food It is important for you to manage your blood sugar (glucose) level. Your blood glucose level can be   greatly affected by what you eat. Eating healthier foods in the appropriate amounts throughout the day at about the same time each day will help you control your blood glucose level. It can also help slow or prevent worsening of your diabetes mellitus. Healthy eating may even help you improve the level of your blood pressure and reach or maintain a healthy weight.  HOW CAN FOOD AFFECT ME? Carbohydrates Carbohydrates affect your blood glucose level more than any other type of food. Your dietitian will help you determine how many carbohydrates to eat at each meal and teach you how to count carbohydrates. Counting carbohydrates is important to keep your blood glucose at a healthy level, especially if you are using insulin or taking certain medicines for  diabetes mellitus. Alcohol Alcohol can cause sudden decreases in blood glucose (hypoglycemia), especially if you use insulin or take certain medicines for diabetes mellitus. Hypoglycemia can be a life-threatening condition. Symptoms of hypoglycemia (sleepiness, dizziness, and disorientation) are similar to symptoms of having too much alcohol.  If your health care provider has given you approval to drink alcohol, do so in moderation and use the following guidelines:  Women should not have more than one drink per day, and men should not have more than two drinks per day. One drink is equal to:  12 oz of beer.  5 oz of wine.  1 oz of hard liquor.  Do not drink on an empty stomach.  Keep yourself hydrated. Have water, diet soda, or unsweetened iced tea.  Regular soda, juice, and other mixers might contain a lot of carbohydrates and should be counted. WHAT FOODS ARE NOT RECOMMENDED? As you make food choices, it is important to remember that all foods are not the same. Some foods have fewer nutrients per serving than other foods, even though they might have the same number of calories or carbohydrates. It is difficult to get your body what it needs when you eat foods with fewer nutrients. Examples of foods that you should avoid that are high in calories and carbohydrates but low in nutrients include:  Trans fats (most processed foods list trans fats on the Nutrition Facts label).  Regular soda.  Juice.  Candy.  Sweets, such as cake, pie, doughnuts, and cookies.  Fried foods. WHAT FOODS CAN I EAT? Have nutrient-rich foods, which will nourish your body and keep you healthy. The food you should eat also will depend on several factors, including:  The calories you need.  The medicines you take.  Your weight.  Your blood glucose level.  Your blood pressure level.  Your cholesterol level. You also should eat a variety of foods, including:  Protein, such as meat, poultry, fish,  tofu, nuts, and seeds (lean animal proteins are best).  Fruits.  Vegetables.  Dairy products, such as milk, cheese, and yogurt (low fat is best).  Breads, grains, pasta, cereal, rice, and beans.  Fats such as olive oil, trans fat-free margarine, canola oil, avocado, and olives. DOES EVERYONE WITH DIABETES MELLITUS HAVE THE SAME MEAL PLAN? Because every person with diabetes mellitus is different, there is not one meal plan that works for everyone. It is very important that you meet with a dietitian who will help you create a meal plan that is just right for you. Document Released: 01/15/2005 Document Revised: 04/25/2013 Document Reviewed: 03/17/2013 ExitCare Patient Information 2015 [employer]. This information is not intended to replace advice given to you by your health care provider. Make sure you discuss any questions   you have with your health care provider.  

## 2014-08-01 NOTE — Progress Notes (Signed)
MRN: [id number] Name: Annette Boyer  Sex: female Age: 54 y.o. DOB: 1960/12/09  Allergies: Bactrim and Penicillins  Chief Complaint  Patient presents with  . Leg Pain    HPI: Patient is 54 y.o. female who has to of hypothyroidism, vitamin D deficiency, multiple joint pain, on the last visit she was started on Cymbalta she reports some improvement still has the pain, she does report family history of rheumatoid arthritis, previous blood work reviewed  As per patient currently she's taking her thyroid medication, also noticed impaired fasting glucose, patient is also complaining of bilateral knee pain denies any fall or trauma, she still smokes cigarettes, I have counseled patient to quit smoking.  Past Medical History  Diagnosis Date  . Thyroid disease     Past Surgical History  Procedure Laterality Date  . Abdominal hysterectomy    . Nasal sinus surgery    . Bladder surgery    . Neck surgery      20 years ago       Medication List       This list is accurate as of: 08/01/14 12:45 PM.  Always use your most recent med list.               cetirizine 10 MG tablet  Commonly known as:  ZYRTEC ALLERGY  Take 1 tablet (10 mg total) by mouth daily.     cyclobenzaprine 10 MG tablet  Commonly known as:  FLEXERIL  Take 1 tablet (10 mg total) by mouth 2 (two) times daily as needed for muscle spasms.     DULoxetine 30 MG capsule  Commonly known as:  CYMBALTA  Take 1 capsule (30 mg total) by mouth daily.     fluticasone 50 MCG/ACT nasal spray  Commonly known as:  FLONASE  Place 2 sprays into both nostrils daily.     levothyroxine 137 MCG tablet  Commonly known as:  SYNTHROID, LEVOTHROID  Take 1 tablet (137 mcg total) by mouth daily before breakfast.     predniSONE 20 MG tablet  Commonly known as:  DELTASONE  Take 2 tablets (40 mg total) by mouth daily.     traZODone 50 MG tablet  Commonly known as:  DESYREL  Take 1 tablet (50 mg total) by mouth at bedtime.     Vitamin D (Ergocalciferol) 50000 UNITS Caps capsule  Commonly known as:  DRISDOL  TAKE 1 CAPSULE BY MOUTH ONCE A WEEK        Meds ordered this encounter  Medications  . DULoxetine (CYMBALTA) 30 MG capsule    Sig: Take 1 capsule (30 mg total) by mouth daily.    Dispense:  30 capsule    Refill:  3    Immunization History  Administered Date(s) Administered  . Influenza,inj,Quad PF,36+ Mos 04/12/2014  . Pneumococcal Polysaccharide-23 04/12/2014    History reviewed. No pertinent family history.  History  Substance Use Topics  . Smoking status: Heavy Tobacco Smoker -- 0.50 packs/day for 7 years    Last Attempt to Quit: 01/23/2014  . Smokeless tobacco: Not on file  . Alcohol Use: No    Review of Systems   As noted in HPI  Filed Vitals:   08/01/14 1126  BP: 133/81  Pulse: 87  Temp: 98.5 F (36.9 C)  Resp: 16    Physical Exam  Physical Exam  Eyes: EOM are normal. Pupils are equal, round, and reactive to light.  Cardiovascular: Normal rate and regular rhythm.   Pulmonary/Chest: Breath sounds  normal. No respiratory distress. She has no wheezes. She has no rales.  Musculoskeletal:  Right foot  minimal tenderness on the back of heel? Nodule. Bilateral tenderness in the MCP joints.  Bilateral knee minimal tenderness, crepitation+      CBC    Component Value Date/Time   WBC 8.4 02/08/2014 1118   RBC 4.40 02/08/2014 1118   HGB 13.5 02/08/2014 1118   HCT 40.1 02/08/2014 1118   PLT 224 02/08/2014 1118   MCV 91.1 02/08/2014 1118   LYMPHSABS 2.8 02/08/2014 1118   MONOABS 0.6 02/08/2014 1118   EOSABS 0.1 02/08/2014 1118   BASOSABS 0.0 02/08/2014 1118    CMP     Component Value Date/Time   NA 138 02/08/2014 1118   K 4.6 02/08/2014 1118   CL 99 02/08/2014 1118   CO2 25 02/08/2014 1118   GLUCOSE 77 02/08/2014 1118   BUN 21 02/08/2014 1118   CREATININE 1.12* 02/08/2014 1118   CALCIUM 9.5 02/08/2014 1118   PROT 7.0 02/08/2014 1118   ALBUMIN 4.2 02/08/2014  1118   AST 16 02/08/2014 1118   ALT 15 02/08/2014 1118   ALKPHOS 60 02/08/2014 1118   BILITOT 0.4 02/08/2014 1118   GFRNONAA 56* 02/08/2014 1118   GFRAA 64 02/08/2014 1118    Lab Results  Component Value Date/Time   CHOL 265* 02/08/2014 11:18 AM    No components found for: HGA1C  Lab Results  Component Value Date/Time   AST 16 02/08/2014 11:18 AM    Assessment and Plan  Hypothyroidism, unspecified hypothyroidism type - Plan: currently patient is on levothyroxine 137 mcg daily, recheck TSH  Vitamin D deficiency Patient is taking vitamin D supplement.  Musculoskeletal pain - Plan:Have increased the dose of  DULoxetine (CYMBALTA) 30 MG capsule  Smoking Counseled patient to quit smoking.  Family history of rheumatoid arthritis - Plan: Rheumatoid factor, ANA, Uric acid  Multiple joint pain - Plan: Rheumatoid factor, ANA, Uric acid  Prediabetes - Plan: advised patient for diabetes meal planning COMPLETE METABOLIC PANEL WITH GFR  Bilateral knee pain - Plan: DG Knee Bilateral Standing AP   Health Maintenance  -Vaccinations:  Up-to-date with flu shot and Pneumovax  Return in about 3 months (around 11/01/2014).   This note has been created with Education officer, environmentalDragon speech recognition software and smart phrase technology. Any transcriptional errors are unintentional.    Doris CheadleADVANI, Swayze Pries, MD

## 2014-08-02 LAB — ANA: Anti Nuclear Antibody(ANA): NEGATIVE

## 2014-08-10 ENCOUNTER — Telehealth: Payer: Self-pay

## 2014-08-10 MED ORDER — LEVOTHYROXINE SODIUM 125 MCG PO TABS: 125.0000 ug | ORAL_TABLET | Freq: Every day | ORAL | Status: DC

## 2014-08-10 NOTE — Telephone Encounter (Signed)
Patient not available Left message on voice mail to return our call [facility] 

## 2014-08-10 NOTE — Telephone Encounter (Signed)
-----   Message from Doris Cheadleeepak Advani, MD sent at 08/02/2014 11:19 AM EDT ----- Patient has abnormal TSH level, advise patient to reduce the dose of levothyroxine to 125 mcg daily. Will repeat that test on the following visit. 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 significance, will check anti-CCP level on the following visit.

## 2014-08-13 ENCOUNTER — Other Ambulatory Visit: Payer: Self-pay

## 2014-08-13 ENCOUNTER — Telehealth: Payer: Self-pay | Admitting: Internal Medicine

## 2014-08-13 ENCOUNTER — Telehealth: Payer: Self-pay

## 2014-08-13 DIAGNOSIS — M7918 Myalgia, other site: Secondary | ICD-10-CM

## 2014-08-13 MED ORDER — DULOXETINE HCL 30 MG PO CPEP: 30.0000 mg | ORAL_CAPSULE | Freq: Every day | ORAL | Status: DC

## 2014-08-13 NOTE — Telephone Encounter (Signed)
Patient called is returning nurse's phone call to review results. Please f/u with pt

## 2014-08-13 NOTE — Telephone Encounter (Signed)
Patient returned call and is aware of her lab results and to pick up  Her new dose of levothyroxine

## 2014-08-31 ENCOUNTER — Other Ambulatory Visit: Payer: Self-pay | Admitting: Internal Medicine

## 2014-09-12 ENCOUNTER — Ambulatory Visit: Payer: Self-pay | Attending: Internal Medicine | Admitting: Internal Medicine

## 2014-09-12 ENCOUNTER — Encounter: Payer: Self-pay | Admitting: Internal Medicine

## 2014-09-12 VITALS — BP 135/86 | HR 86 | Temp 97.8°F | Resp 16 | Wt 222.6 lb

## 2014-09-12 DIAGNOSIS — E039 Hypothyroidism, unspecified: Secondary | ICD-10-CM

## 2014-09-12 DIAGNOSIS — Z8261 Family history of arthritis: Secondary | ICD-10-CM

## 2014-09-12 DIAGNOSIS — M2550 Pain in unspecified joint: Secondary | ICD-10-CM

## 2014-09-12 DIAGNOSIS — M170 Bilateral primary osteoarthritis of knee: Secondary | ICD-10-CM

## 2014-09-12 LAB — TSH: TSH: 0.024 u[IU]/mL — AB (ref 0.350–4.500)

## 2014-09-12 MED ORDER — NAPROXEN 500 MG PO TABS: 500.0000 mg | ORAL_TABLET | Freq: Two times a day (BID) | ORAL | Status: DC

## 2014-09-12 NOTE — Progress Notes (Signed)
MRN: [id number] Name: Annette Boyer  Sex: female Age: 54 y.o. DOB: [birth year]  Allergies: Bactrim and Penicillins  Chief Complaint  Patient presents with  . Follow-up    HPI: Patient is 54 y.o. female who has history of hypothyroidism, multiple joint pain, currently on Cymbalta, she had a blood work done which was reviewed with the patient her rheumatoid factor was weakly positive, she still has multiple joint pain, had x-ray of both knees reported mild osteoarthritis, she's requesting some pain medication.also her thyroid medication dose was decreased needs and her repeat TSH level.  Past Medical History  Diagnosis Date  . Thyroid disease     Past Surgical History  Procedure Laterality Date  . Abdominal hysterectomy    . Nasal sinus surgery    . Bladder surgery    . Neck surgery      20 years ago       Medication List       This list is accurate as of: 09/12/14  3:01 PM.  Always use your most recent med list.               cetirizine 10 MG tablet  Commonly known as:  ZYRTEC ALLERGY  Take 1 tablet (10 mg total) by mouth daily.     cyclobenzaprine 10 MG tablet  Commonly known as:  FLEXERIL  Take 1 tablet (10 mg total) by mouth 2 (two) times daily as needed for muscle spasms.     DULoxetine 30 MG capsule  Commonly known as:  CYMBALTA  Take 1 capsule (30 mg total) by mouth daily.     fluticasone 50 MCG/ACT nasal spray  Commonly known as:  FLONASE  Place 2 sprays into both nostrils daily.     levothyroxine 125 MCG tablet  Commonly known as:  SYNTHROID, LEVOTHROID  Take 1 tablet (125 mcg total) by mouth daily.     naproxen 500 MG tablet  Commonly known as:  NAPROSYN  Take 1 tablet (500 mg total) by mouth 2 (two) times daily with a meal.     predniSONE 20 MG tablet  Commonly known as:  DELTASONE  Take 2 tablets (40 mg total) by mouth daily.     traZODone 50 MG tablet  Commonly known as:  DESYREL  Take 1 tablet (50 mg total) by mouth at bedtime.       Vitamin D (Ergocalciferol) 50000 UNITS Caps capsule  Commonly known as:  DRISDOL  TAKE 1 CAPSULE BY MOUTH ONCE A WEEK        Meds ordered this encounter  Medications  . naproxen (NAPROSYN) 500 MG tablet    Sig: Take 1 tablet (500 mg total) by mouth 2 (two) times daily with a meal.    Dispense:  30 tablet    Refill:  2    Immunization History  Administered Date(s) Administered  . Influenza,inj,Quad PF,36+ Mos 04/12/2014  . Pneumococcal Polysaccharide-23 04/12/2014    History reviewed. No pertinent family history.  History  Substance Use Topics  . Smoking status: Heavy Tobacco Smoker -- 0.50 packs/day for 7 years    Last Attempt to Quit: 01/23/2014  . Smokeless tobacco: Not on file  . Alcohol Use: No    Review of Systems   As noted in HPI  Filed Vitals:   09/12/14 1419  BP: 135/86  Pulse: 86  Temp: 97.8 F (36.6 C)  Resp: 16    Physical Exam  Physical Exam  Constitutional: No distress.  Eyes: EOM are  normal. Pupils are equal, round, and reactive to light.  Cardiovascular: Normal rate and regular rhythm.   Pulmonary/Chest: Breath sounds normal. No respiratory distress. She has no wheezes. She has no rales.  Musculoskeletal:  Bilateral knee crepitation+    CBC    Component Value Date/Time   WBC 8.4 02/08/2014 1118   RBC 4.40 02/08/2014 1118   HGB 13.5 02/08/2014 1118   HCT 40.1 02/08/2014 1118   PLT 224 02/08/2014 1118   MCV 91.1 02/08/2014 1118   LYMPHSABS 2.8 02/08/2014 1118   MONOABS 0.6 02/08/2014 1118   EOSABS 0.1 02/08/2014 1118   BASOSABS 0.0 02/08/2014 1118    CMP     Component Value Date/Time   NA 139 08/01/2014 1220   K 4.6 08/01/2014 1220   CL 108 08/01/2014 1220   CO2 23 08/01/2014 1220   GLUCOSE 95 08/01/2014 1220   BUN 18 08/01/2014 1220   CREATININE 1.07 08/01/2014 1220   CALCIUM 9.1 08/01/2014 1220   PROT 6.8 08/01/2014 1220   ALBUMIN 4.0 08/01/2014 1220   AST 14 08/01/2014 1220   ALT 14 08/01/2014 1220   ALKPHOS 84  08/01/2014 1220   BILITOT 0.3 08/01/2014 1220   GFRNONAA 59* 08/01/2014 1220   GFRAA 68 08/01/2014 1220    Lab Results  Component Value Date/Time   CHOL 265* 02/08/2014 11:18 AM    Lab Results  Component Value Date/Time   HGBA1C 5.7* 02/08/2014 11:18 AM    Lab Results  Component Value Date/Time   AST 14 08/01/2014 12:20 PM    Assessment and Plan  Osteoarthritis of both knees, unspecified osteoarthritis type - Plan: naproxen (NAPROSYN) 500 MG tablet  Multiple joint pain - Plan: Cyclic citrul peptide antibody, IgG  Family history of rheumatoid arthritis - Plan: Cyclic citrul peptide antibody, IgG  Hypothyroidism, unspecified hypothyroidism type - Plan:currently patient is taking levothyroxine 125 mcg daily, recheck TSH    Return in about 3 months (around 12/13/2014), or if symptoms worsen or fail to improve.   This note has been created with Education officer, environmentalDragon speech recognition software and smart phrase technology. Any transcriptional errors are unintentional.    Doris CheadleADVANI, Katie Moch, MD

## 2014-09-12 NOTE — Progress Notes (Signed)
Patient here for follow up from the ED Patient was seen for pain from her arthritis Patient was told to follow up with her primary doctor

## 2014-09-13 LAB — CYCLIC CITRUL PEPTIDE ANTIBODY, IGG: Cyclic Citrullin Peptide Ab: <2 U/mL (ref 0.0–5.0)

## 2014-09-17 ENCOUNTER — Telehealth: Payer: Self-pay

## 2014-09-17 NOTE — Telephone Encounter (Signed)
Patient not available Left message with person who answered the phone to have Her return our call

## 2014-09-17 NOTE — Telephone Encounter (Signed)
-----   Message from Doris Cheadleeepak Advani, MD sent at 09/13/2014  2:19 PM EDT ----- Call and let the patient know that her blood work shows abnormal TSH level, advise patient to reduce the  dose of levothyroxine to 112 mcg daily, will recheck TSH level on following visit Also let the patient know that her test for rheumatoid arthritis is negative.

## 2014-09-17 NOTE — Telephone Encounter (Signed)
Pt returning call, please call on [phone].

## 2014-10-09 ENCOUNTER — Emergency Department (HOSPITAL_COMMUNITY)
Admission: EM | Admit: 2014-10-09 | Discharge: 2014-10-09 | Disposition: A | Discharge status: Home / Self Care | Payer: Self-pay | Attending: Emergency Medicine | Admitting: Emergency Medicine

## 2014-10-09 ENCOUNTER — Encounter (HOSPITAL_COMMUNITY): Payer: Self-pay | Admitting: Emergency Medicine

## 2014-10-09 ENCOUNTER — Emergency Department (HOSPITAL_COMMUNITY): Payer: Self-pay

## 2014-10-09 DIAGNOSIS — Z7951 Long term (current) use of inhaled steroids: Secondary | ICD-10-CM | POA: Insufficient documentation

## 2014-10-09 DIAGNOSIS — Y998 Other external cause status: Secondary | ICD-10-CM | POA: Insufficient documentation

## 2014-10-09 DIAGNOSIS — S8991XA Unspecified injury of right lower leg, initial encounter: Secondary | ICD-10-CM | POA: Insufficient documentation

## 2014-10-09 DIAGNOSIS — W07XXXA Fall from chair, initial encounter: Secondary | ICD-10-CM | POA: Insufficient documentation

## 2014-10-09 DIAGNOSIS — M25561 Pain in right knee: Secondary | ICD-10-CM

## 2014-10-09 DIAGNOSIS — S199XXA Unspecified injury of neck, initial encounter: Secondary | ICD-10-CM | POA: Insufficient documentation

## 2014-10-09 DIAGNOSIS — Z79899 Other long term (current) drug therapy: Secondary | ICD-10-CM | POA: Insufficient documentation

## 2014-10-09 DIAGNOSIS — E079 Disorder of thyroid, unspecified: Secondary | ICD-10-CM | POA: Insufficient documentation

## 2014-10-09 DIAGNOSIS — M25562 Pain in left knee: Secondary | ICD-10-CM

## 2014-10-09 DIAGNOSIS — Y9389 Activity, other specified: Secondary | ICD-10-CM | POA: Insufficient documentation

## 2014-10-09 DIAGNOSIS — Y9289 Other specified places as the place of occurrence of the external cause: Secondary | ICD-10-CM | POA: Insufficient documentation

## 2014-10-09 DIAGNOSIS — W19XXXA Unspecified fall, initial encounter: Secondary | ICD-10-CM

## 2014-10-09 DIAGNOSIS — S3992XA Unspecified injury of lower back, initial encounter: Secondary | ICD-10-CM | POA: Insufficient documentation

## 2014-10-09 DIAGNOSIS — S8992XA Unspecified injury of left lower leg, initial encounter: Secondary | ICD-10-CM | POA: Insufficient documentation

## 2014-10-09 DIAGNOSIS — Z720 Tobacco use: Secondary | ICD-10-CM | POA: Insufficient documentation

## 2014-10-09 DIAGNOSIS — Z791 Long term (current) use of non-steroidal anti-inflammatories (NSAID): Secondary | ICD-10-CM | POA: Insufficient documentation

## 2014-10-09 DIAGNOSIS — Z880 Allergy status to penicillin: Secondary | ICD-10-CM | POA: Insufficient documentation

## 2014-10-09 DIAGNOSIS — S0990XA Unspecified injury of head, initial encounter: Secondary | ICD-10-CM | POA: Insufficient documentation

## 2014-10-09 DIAGNOSIS — M549 Dorsalgia, unspecified: Secondary | ICD-10-CM

## 2014-10-09 NOTE — ED Notes (Signed)
Pt went to sit in the chair, missed the chair and hit the concrete floor. EMS states she initially passed [facility], but then began complaining of lower extremity pain and numbness. Complaining of right neck/back and left leg pain. Per EMS good pulse/motor/sensory in all extremities.  In rehab for narcotic abuse and isn't allowed narcotic medication

## 2014-10-09 NOTE — Discharge Instructions (Signed)
May take tylenol or motrin as needed for any ongoing pain. Please follow-up with your primary care physician. Return here for new concerns.

## 2014-10-09 NOTE — ED Notes (Signed)
[facility] Expected date:  Expected time:  Means of arrival:  Comments: EMS - Fall

## 2014-10-09 NOTE — ED Provider Notes (Signed)
CSN: [id number]     Arrival date & time 10/09/14  1554 History   First MD Initiated Contact with Patient 10/09/14 1615     Chief Complaint  Patient presents with  . Fall  . Neck Pain  . Back Pain  . Claudication     (Consider location/radiation/quality/duration/timing/severity/associated sxs/prior Treatment) Patient is a 54 y.o. female presenting with fall, neck pain, and back pain. The history is provided by the patient and medical records.  Fall Associated symptoms include arthralgias and neck pain.  Neck Pain Back Pain   This is a 54 y.o. F with PMH significant for thyroid disease, presenting to here after a fall.  Patient states she went to sit down in a chair, chair slipped out from under her and she landed on the concrete floor onto her low back.  She states she did strike her head but did not lose consciousness.  Patient states she has pain on the right side of her neck, low back, and bilateral knees.  States her neck just feels "sore".  States she was told a few months ago that cartilage in her knees was degrading and she thinks the fall today made it worse.  She denies numbness/weakness of extremities.  No loss of bowel or bladder control.  Patient is currently in rehab for narcotic abuse and cannot take narcotic prescription meds at present.  She denies current headache, dizziness, lightheadedness, visual disturbance, confusion, changes in speech, focal numbness/weakness.  Past Medical History  Diagnosis Date  . Thyroid disease    Past Surgical History  Procedure Laterality Date  . Abdominal hysterectomy    . Nasal sinus surgery    . Bladder surgery    . Neck surgery      20 years ago    History reviewed. No pertinent family history. History  Substance Use Topics  . Smoking status: Heavy Tobacco Smoker -- 0.50 packs/day for 7 years    Last Attempt to Quit: 01/23/2014  . Smokeless tobacco: Not on file  . Alcohol Use: No   OB History    No data available     Review  of Systems  Musculoskeletal: Positive for back pain, arthralgias and neck pain.  All other systems reviewed and are negative.     Allergies  Bactrim and Penicillins  Home Medications   Prior to Admission medications   Medication Sig Start Date End Date Taking? Authorizing Provider  cetirizine (ZYRTEC ALLERGY) 10 MG tablet Take 1 tablet (10 mg total) by mouth daily. 02/08/14   Doris Cheadleeepak Advani, MD  cyclobenzaprine (FLEXERIL) 10 MG tablet Take 1 tablet (10 mg total) by mouth 2 (two) times daily as needed for muscle spasms. 01/28/14   Kaitlyn Szekalski, PA-C  DULoxetine (CYMBALTA) 30 MG capsule Take 1 capsule (30 mg total) by mouth daily. 08/13/14   Doris Cheadleeepak Advani, MD  fluticasone (FLONASE) 50 MCG/ACT nasal spray Place 2 sprays into both nostrils daily. 02/08/14   Doris Cheadleeepak Advani, MD  levothyroxine (SYNTHROID, LEVOTHROID) 125 MCG tablet Take 1 tablet (125 mcg total) by mouth daily. 08/10/14   Doris Cheadleeepak Advani, MD  naproxen (NAPROSYN) 500 MG tablet Take 1 tablet (500 mg total) by mouth 2 (two) times daily with a meal. 09/12/14   Doris Cheadleeepak Advani, MD  predniSONE (DELTASONE) 20 MG tablet Take 2 tablets (40 mg total) by mouth daily. 01/28/14   Kaitlyn Szekalski, PA-C  traZODone (DESYREL) 50 MG tablet Take 1 tablet (50 mg total) by mouth at bedtime. 01/28/14   Emilia BeckKaitlyn Szekalski, PA-C  Vitamin D, Ergocalciferol, (DRISDOL) 50000 UNITS CAPS capsule TAKE 1 CAPSULE BY MOUTH ONCE A WEEK 09/17/14   Deepak Advani, MD   BP 134/77 mmHg  Pulse 68  Resp 19  SpO2 99%   Physical Exam  Constitutional: She is oriented to person, place, and time. She appears well-developed and well-nourished. No distress.  HENT:  Head: Normocephalic and atraumatic.  Mouth/Throat: Oropharynx is clear and moist.  No visible signs of head trauma  Eyes: Conjunctivae and EOM are normal. Pupils are equal, round, and reactive to light.  Neck: Normal range of motion and full passive range of motion without pain. Neck supple. Muscular tenderness  (right) present. No rigidity. Normal range of motion present.  Cardiovascular: Normal rate, regular rhythm and normal heart sounds.   Pulmonary/Chest: Effort normal and breath sounds normal. No respiratory distress. She has no wheezes.  Abdominal: Soft. Bowel sounds are normal. There is no tenderness. There is no guarding.  Musculoskeletal: Normal range of motion. She exhibits no edema.       Cervical back: She exhibits tenderness and pain. She exhibits no bony tenderness.       Lumbar back: She exhibits tenderness, bony tenderness and pain.       Back:  Tenderness of left anterior knee and right posterior knee; no bruising or bony deformities; no skin tears or bleeding Tenderness along right side of neck along trapezius muscle; no midline tenderness or step-off; full ROM LS with midline tenderness; no gross deformity Normal strength and sensation of BLE  Neurological: She is alert and oriented to person, place, and time.  AAOx3, answering questions appropriately; equal strength UE and LE bilaterally; CN grossly intact; moves all extremities appropriately without ataxia; no focal neuro deficits or facial asymmetry appreciated  Skin: Skin is warm and dry. She is not diaphoretic.  Psychiatric: She has a normal mood and affect.  Nursing note and vitals reviewed.   ED Course  Procedures (including critical care time) Labs Review Labs Reviewed - No data to display  Imaging Review Dg Lumbar Spine Complete  10/09/2014   CLINICAL DATA:  Fall from chair 2 4 with back pain  EXAM: LUMBAR SPINE - COMPLETE 4+ VIEW  COMPARISON:  None.  FINDINGS: There is no evidence of lumbar spine fracture. Alignment is normal. Intervertebral disc spaces are maintained. Partial sacralization of the fifth lumbar vertebra is noted on the left. No compression deformity is seen.  IMPRESSION: No acute abnormality is noted. Partial sacralization of the fifth lumbar vertebra is noted on the left.   Electronically Signed   By:  Alcide Clever M.D.   On: 10/09/2014 17:21   Dg Knee Complete 4 Views Left  10/09/2014   CLINICAL DATA:  Fall from chair with knee pain, initial encounter  EXAM: LEFT KNEE - COMPLETE 4+ VIEW  COMPARISON:  None.  FINDINGS: Degenerative changes are noted in all 3 joint compartments without evidence of acute fracture. No gross soft tissue abnormality is seen.  IMPRESSION: Degenerative change without acute abnormality.   Electronically Signed   By: Alcide Clever M.D.   On: 10/09/2014 17:23   Dg Knee Complete 4 Views Right  10/09/2014   CLINICAL DATA:  Larey Seat off a rolling chair today landing on floor, BILATERAL knee and low back pain since  EXAM: RIGHT KNEE - COMPLETE 4+ VIEW  COMPARISON:  None  FINDINGS: Osseous demineralization.  Mild medial compartment joint space narrowing and minimal spur formation  Remaining joint spaces preserved.  No acute fracture, dislocation  or bone destruction.  No knee joint effusion.  IMPRESSION: Osseous demineralization with mild degenerative changes at medial compartment RIGHT knee.  No acute abnormalities.   Electronically Signed   By: Ulyses Southward M.D.   On: 10/09/2014 17:25     EKG Interpretation None      MDM   Final diagnoses:  Fall, initial encounter  Back pain, unspecified location  Knee pain, bilateral   54 y.o. F with fall from chair earlier today.  She did strike her head but did not lose consciousness.  Now complains of right sided neck, low back pain and bilateral knee pain.  She is awake, alert, and appropriately oriented.  No signs of head trauma or focal neurologic deficits noted.  Tenderness along right paraspinal region without midline tenderness.  There is tenderness of LS without noted deformity.  Both knees are tender-- left anterior and right posterior.  She has normal strength and sensation of all 4 extremities without signs or symptoms concerning for central cord syndrome or cauda equina. X-rays were obtained which are negative for acute findings.   Patient d/c home with supportive care.  Given copies of x-rays, encouraged to FU with PCP.  Discussed plan with patient, he/she acknowledged understanding and agreed with plan of care.  Return precautions given for new or worsening symptoms.  Garlon Hatchet, PA-C 10/09/14 2226  Benjiman Core, MD 10/10/14 0000

## 2014-11-08 ENCOUNTER — Encounter: Payer: Self-pay | Admitting: Internal Medicine

## 2014-11-08 ENCOUNTER — Ambulatory Visit: Payer: Self-pay | Attending: Internal Medicine | Admitting: Internal Medicine

## 2014-11-08 VITALS — BP 110/78 | HR 93 | Temp 98.0°F | Resp 16 | Wt 220.0 lb

## 2014-11-08 DIAGNOSIS — E079 Disorder of thyroid, unspecified: Secondary | ICD-10-CM | POA: Insufficient documentation

## 2014-11-08 DIAGNOSIS — R1011 Right upper quadrant pain: Secondary | ICD-10-CM | POA: Insufficient documentation

## 2014-11-08 DIAGNOSIS — F17210 Nicotine dependence, cigarettes, uncomplicated: Secondary | ICD-10-CM | POA: Insufficient documentation

## 2014-11-08 DIAGNOSIS — M170 Bilateral primary osteoarthritis of knee: Secondary | ICD-10-CM | POA: Insufficient documentation

## 2014-11-08 DIAGNOSIS — R300 Dysuria: Secondary | ICD-10-CM | POA: Insufficient documentation

## 2014-11-08 DIAGNOSIS — R110 Nausea: Secondary | ICD-10-CM | POA: Insufficient documentation

## 2014-11-08 LAB — POCT URINALYSIS DIPSTICK
Bilirubin, UA: NEGATIVE
Glucose, UA: NEGATIVE
KETONES UA: NEGATIVE
Leukocytes, UA: NEGATIVE
Nitrite, UA: NEGATIVE
PH UA: 6.5
RBC UA: NEGATIVE
Spec Grav, UA: 1.02
UROBILINOGEN UA: 0.2

## 2014-11-08 MED ORDER — IBUPROFEN 600 MG PO TABS: 600.0000 mg | ORAL_TABLET | Freq: Three times a day (TID) | ORAL | Status: DC | PRN

## 2014-11-08 NOTE — Progress Notes (Signed)
Patient complains of after she eats she feels like she has hard brick in  Her epigastric area and some vomiting Patient states she is still having pain to her right heel Patient states that when she laughs too hard she sees nothing but black

## 2014-11-08 NOTE — Progress Notes (Signed)
MRN: [id number] Name: Annette Boyer  Sex: female Age: 54 y.o. DOB: June 02, 1960  Allergies: Bactrim and Penicillins  Chief Complaint  Patient presents with  . Abdominal Pain    HPI: Patient is 54 y.o. female who history of osteoarthritis of knees, comes today complaining of on and off upper abdominal pain worse with certain foods does report occasional nausea denies any vomiting, denies any reflux symptoms, she also reports some urinary symptoms of burning sensation denies any frequency denies any change in bowel habits denies any fever chills denies any chest pain or shortness of breath, patient has been taking naproxen for knee pain as per patient it does not help her she was to try ibuprofen.  Past Medical History  Diagnosis Date  . Thyroid disease     Past Surgical History  Procedure Laterality Date  . Abdominal hysterectomy    . Nasal sinus surgery    . Bladder surgery    . Neck surgery      20 years ago       Medication List       This list is accurate as of: 11/08/14 11:57 AM.  Always use your most recent med list.               cetirizine 10 MG tablet  Commonly known as:  ZYRTEC ALLERGY  Take 1 tablet (10 mg total) by mouth daily.     cyclobenzaprine 10 MG tablet  Commonly known as:  FLEXERIL  Take 1 tablet (10 mg total) by mouth 2 (two) times daily as needed for muscle spasms.     DULoxetine 30 MG capsule  Commonly known as:  CYMBALTA  Take 1 capsule (30 mg total) by mouth daily.     fluticasone 50 MCG/ACT nasal spray  Commonly known as:  FLONASE  Place 2 sprays into both nostrils daily.     ibuprofen 600 MG tablet  Commonly known as:  ADVIL,MOTRIN  Take 1 tablet (600 mg total) by mouth every 8 (eight) hours as needed.     levothyroxine 125 MCG tablet  Commonly known as:  SYNTHROID, LEVOTHROID  Take 1 tablet (125 mcg total) by mouth daily.     predniSONE 20 MG tablet  Commonly known as:  DELTASONE  Take 2 tablets (40 mg total) by mouth  daily.     traZODone 50 MG tablet  Commonly known as:  DESYREL  Take 1 tablet (50 mg total) by mouth at bedtime.     Vitamin D (Ergocalciferol) 50000 UNITS Caps capsule  Commonly known as:  DRISDOL  TAKE 1 CAPSULE BY MOUTH ONCE A WEEK        Meds ordered this encounter  Medications  . ibuprofen (ADVIL,MOTRIN) 600 MG tablet    Sig: Take 1 tablet (600 mg total) by mouth every 8 (eight) hours as needed.    Dispense:  30 tablet    Refill:  2    Immunization History  Administered Date(s) Administered  . Influenza,inj,Quad PF,36+ Mos 04/12/2014  . Pneumococcal Polysaccharide-23 04/12/2014    History reviewed. No pertinent family history.  History  Substance Use Topics  . Smoking status: Heavy Tobacco Smoker -- 0.50 packs/day for 7 years    Last Attempt to Quit: 01/23/2014  . Smokeless tobacco: Not on file  . Alcohol Use: No    Review of Systems   As noted in HPI  Filed Vitals:   11/08/14 1036  BP: 110/78  Pulse: 93  Temp: 98 F (36.7 C)  Resp: 16    Physical Exam  Physical Exam  Constitutional: No distress.  Cardiovascular: Normal rate and regular rhythm.   Pulmonary/Chest: Breath sounds normal. No respiratory distress. She has no wheezes. She has no rales.  Abdominal: There is no rebound and no guarding.  Minimal tenderness in right upper quadrant with deep palpation, Murphy's negative.  No CVA tenderness.  Musculoskeletal: She exhibits no edema.    CBC    Component Value Date/Time   WBC 8.4 02/08/2014 1118   RBC 4.40 02/08/2014 1118   HGB 13.5 02/08/2014 1118   HCT 40.1 02/08/2014 1118   PLT 224 02/08/2014 1118   MCV 91.1 02/08/2014 1118   LYMPHSABS 2.8 02/08/2014 1118   MONOABS 0.6 02/08/2014 1118   EOSABS 0.1 02/08/2014 1118   BASOSABS 0.0 02/08/2014 1118    CMP     Component Value Date/Time   NA 139 08/01/2014 1220   K 4.6 08/01/2014 1220   CL 108 08/01/2014 1220   CO2 23 08/01/2014 1220   GLUCOSE 95 08/01/2014 1220   BUN 18  08/01/2014 1220   CREATININE 1.07 08/01/2014 1220   CALCIUM 9.1 08/01/2014 1220   PROT 6.8 08/01/2014 1220   ALBUMIN 4.0 08/01/2014 1220   AST 14 08/01/2014 1220   ALT 14 08/01/2014 1220   ALKPHOS 84 08/01/2014 1220   BILITOT 0.3 08/01/2014 1220   GFRNONAA 59* 08/01/2014 1220   GFRAA 68 08/01/2014 1220    Lab Results  Component Value Date/Time   CHOL 265* 02/08/2014 11:18 AM    Lab Results  Component Value Date/Time   HGBA1C 5.7* 02/08/2014 11:18 AM    Lab Results  Component Value Date/Time   AST 14 08/01/2014 12:20 PM    Assessment and Plan  Dysuria - Plan:  Results for orders placed or performed in visit on 11/08/14  Urinalysis Dipstick  Result Value Ref Range   Color, UA yellow    Clarity, UA clear    Glucose, UA neg    Bilirubin, UA neg    Ketones, UA neg    Spec Grav, UA 1.020    Blood, UA neg    pH, UA 6.5    Protein, UA trace    Urobilinogen, UA 0.2    Nitrite, UA neg    Leukocytes, UA Negative Negative   Urinalysis Dipstick is negative for infection. Has trace protein, will repeat urine in the future  Osteoarthritis of both knees, unspecified osteoarthritis type - Plan: ibuprofen (ADVIL,MOTRIN) 600 MG tablet  Right upper quadrant pain - Plan: US Abdomen Complete   Return in about 3 months (around 02/08/2015), or if symptoms worsen or fail to improve.   This note has been created with Education officer, environmentalDragon speech recognition software and smart phrase technology. Any transcriptional errors are unintentional.    Doris CheadleADVANI, Annaly Skop, MD

## 2014-11-09 ENCOUNTER — Other Ambulatory Visit: Payer: Self-pay

## 2014-11-09 MED ORDER — CETIRIZINE HCL 10 MG PO CAPS: 10.0000 mg | ORAL_CAPSULE | Freq: Every day | ORAL | Status: DC

## 2014-11-16 ENCOUNTER — Ambulatory Visit (HOSPITAL_COMMUNITY)
Admission: RE | Admit: 2014-11-16 | Discharge: 2014-11-16 | Disposition: A | Discharge status: Home / Self Care | Payer: Self-pay | Source: Ambulatory Visit | Attending: Internal Medicine | Admitting: Internal Medicine

## 2014-11-16 DIAGNOSIS — R1011 Right upper quadrant pain: Secondary | ICD-10-CM | POA: Insufficient documentation

## 2014-11-16 DIAGNOSIS — Z90710 Acquired absence of both cervix and uterus: Secondary | ICD-10-CM | POA: Insufficient documentation

## 2014-11-20 ENCOUNTER — Telehealth: Payer: Self-pay

## 2014-11-20 NOTE — Telephone Encounter (Signed)
Patient not available Unable to leave voice mail  Call automatically disconnected

## 2014-11-20 NOTE — Telephone Encounter (Signed)
-----   Message from Doris Cheadleeepak Advani, MD sent at 11/19/2014 12:41 PM EDT ----- Call and let the patient know that her abdominal ultrasound is reported to be normal.  IMPRESSION: 1. The liver, common bile duct, gallbladder, and visualized portions of the pancreas are unremarkable. 2. The spleen is top-normal in size but exhibits normal echotexture. 3. There is no acute abnormality of the kidneys.

## 2014-11-27 ENCOUNTER — Telehealth: Payer: Self-pay

## 2014-11-27 NOTE — Telephone Encounter (Signed)
Returned patient phone call Patient not available Left message with person who answered the phone to have her Return our call

## 2014-11-27 NOTE — Telephone Encounter (Signed)
Patient called returning nurse's call to review results. Please f/u with patient

## 2014-11-27 NOTE — Telephone Encounter (Signed)
Patient returned phone call and she is aware of her [place] results

## 2015-04-04 ENCOUNTER — Ambulatory Visit: Payer: Self-pay | Attending: Family Medicine

## 2015-04-24 ENCOUNTER — Encounter: Payer: Self-pay | Admitting: Family Medicine

## 2015-04-24 ENCOUNTER — Ambulatory Visit: Payer: Self-pay | Attending: Family Medicine | Admitting: Family Medicine

## 2015-04-24 VITALS — BP 110/78 | HR 82 | Temp 99.0°F | Resp 16 | Ht 63.0 in | Wt 217.0 lb

## 2015-04-24 DIAGNOSIS — F1911 Other psychoactive substance abuse, in remission: Secondary | ICD-10-CM | POA: Insufficient documentation

## 2015-04-24 DIAGNOSIS — Z1159 Encounter for screening for other viral diseases: Secondary | ICD-10-CM | POA: Insufficient documentation

## 2015-04-24 DIAGNOSIS — Z87898 Personal history of other specified conditions: Secondary | ICD-10-CM | POA: Insufficient documentation

## 2015-04-24 DIAGNOSIS — F32A Depression, unspecified: Secondary | ICD-10-CM | POA: Insufficient documentation

## 2015-04-24 DIAGNOSIS — Z23 Encounter for immunization: Secondary | ICD-10-CM | POA: Insufficient documentation

## 2015-04-24 DIAGNOSIS — M25551 Pain in right hip: Secondary | ICD-10-CM | POA: Insufficient documentation

## 2015-04-24 DIAGNOSIS — R21 Rash and other nonspecific skin eruption: Secondary | ICD-10-CM | POA: Insufficient documentation

## 2015-04-24 DIAGNOSIS — F17210 Nicotine dependence, cigarettes, uncomplicated: Secondary | ICD-10-CM | POA: Insufficient documentation

## 2015-04-24 DIAGNOSIS — Z720 Tobacco use: Secondary | ICD-10-CM | POA: Insufficient documentation

## 2015-04-24 DIAGNOSIS — Z0000 Encounter for general adult medical examination without abnormal findings: Secondary | ICD-10-CM | POA: Insufficient documentation

## 2015-04-24 DIAGNOSIS — G8929 Other chronic pain: Secondary | ICD-10-CM | POA: Insufficient documentation

## 2015-04-24 DIAGNOSIS — R894 Abnormal immunological findings in specimens from other organs, systems and tissues: Secondary | ICD-10-CM | POA: Insufficient documentation

## 2015-04-24 DIAGNOSIS — F329 Major depressive disorder, single episode, unspecified: Secondary | ICD-10-CM | POA: Insufficient documentation

## 2015-04-24 DIAGNOSIS — Z79899 Other long term (current) drug therapy: Secondary | ICD-10-CM | POA: Insufficient documentation

## 2015-04-24 DIAGNOSIS — M1711 Unilateral primary osteoarthritis, right knee: Secondary | ICD-10-CM | POA: Insufficient documentation

## 2015-04-24 DIAGNOSIS — M79671 Pain in right foot: Secondary | ICD-10-CM

## 2015-04-24 DIAGNOSIS — R768 Other specified abnormal immunological findings in serum: Secondary | ICD-10-CM

## 2015-04-24 DIAGNOSIS — Z114 Encounter for screening for human immunodeficiency virus [HIV]: Secondary | ICD-10-CM | POA: Insufficient documentation

## 2015-04-24 DIAGNOSIS — E039 Hypothyroidism, unspecified: Secondary | ICD-10-CM | POA: Insufficient documentation

## 2015-04-24 LAB — TSH: TSH: 0.01 u[IU]/mL — ABNORMAL LOW (ref 0.350–4.500)

## 2015-04-24 MED ORDER — LEVOTHYROXINE SODIUM 125 MCG PO TABS: 125.0000 ug | ORAL_TABLET | Freq: Every day | ORAL | Status: DC

## 2015-04-24 MED ORDER — CYCLOBENZAPRINE HCL 10 MG PO TABS: 10.0000 mg | ORAL_TABLET | Freq: Two times a day (BID) | ORAL | Status: DC | PRN

## 2015-04-24 MED ORDER — TRIAMCINOLONE ACETONIDE 0.1 % EX CREA: 1.0000 "application " | TOPICAL_CREAM | Freq: Two times a day (BID) | CUTANEOUS | Status: DC

## 2015-04-24 MED ORDER — METHYLPREDNISOLONE ACETATE 40 MG/ML IJ SUSP
40.0000 mg | Freq: Once | INTRAMUSCULAR | Status: AC
  Administered 2015-04-24: 40 mg via INTRAMUSCULAR

## 2015-04-24 MED ORDER — ACETAMINOPHEN-CODEINE #3 300-30 MG PO TABS: 1.0000 | ORAL_TABLET | Freq: Two times a day (BID) | ORAL | Status: DC | PRN

## 2015-04-24 NOTE — Assessment & Plan Note (Signed)
Podiatry referral

## 2015-04-24 NOTE — Progress Notes (Signed)
F/U knee problems and pain Unable to move, worsen in the past two month Pain scale #4 Tobacco user 1ppday  No suicidal thoughts in the past two weeks

## 2015-04-24 NOTE — Patient Instructions (Addendum)
Annette Boyer was seen today for leg pain.  Diagnoses and all orders for this visit:  Healthcare maintenance -     Flu Vaccine QUAD 36+ mos IM  Hypothyroidism, unspecified hypothyroidism type -     levothyroxine (SYNTHROID, LEVOTHROID) 125 MCG tablet; Take 1 tablet (125 mcg total) by mouth daily. -     TSH  Need for hepatitis C screening test -     Hepatitis C antibody, reflex  Screening for HIV (human immunodeficiency virus) -     HIV antibody (with reflex)  Primary osteoarthritis of right knee -     methylPREDNISolone acetate (DEPO-MEDROL) injection 40 mg; Inject 1 mL (40 mg total) into the muscle once. -     acetaminophen-codeine (TYLENOL #3) 300-30 MG tablet; Take 1 tablet by mouth 2 (two) times daily as needed for moderate pain.  Tobacco abuse  Pain of right heel -     Ambulatory referral to Podiatry  Right hip pain -     cyclobenzaprine (FLEXERIL) 10 MG tablet; Take 1 tablet (10 mg total) by mouth 2 (two) times daily as needed for muscle spasms.  Depression  History of substance abuse  Skin rash -     triamcinolone cream (KENALOG) 0.1 %; Apply 1 application topically 2 (two) times daily.   Smoking cessation support: smoking cessation hotline: 1-800-QUIT-NOW.  Smoking cessation classes are available through [facility]. Call [phone] or visit our website at [email or web address].   You have received a shot of steroid in your R knee today. Rest and ice knee today. Regular activity tomorrow. Look out for redness, swelling, fever,severe pain in joint and call if you experience these symptoms.  You will be called with lab results   F/u in 6 weeks for pap smear   Dr. Armen PickupFunches

## 2015-04-24 NOTE — Assessment & Plan Note (Addendum)
Due for TSH check TSH ordered    TSH is low suggesting synthroid dose is too high, dose decreased to 100 mcg

## 2015-04-24 NOTE — Assessment & Plan Note (Signed)
A: R knee DJD with suspected Baker's cyst P: Steroid injection Oral NSAID Tylenol #3 Weight loss

## 2015-04-24 NOTE — Assessment & Plan Note (Signed)
Pruritic rash on back of L calf following bruise with slowly resolving hematoma  Steroid cream ordered

## 2015-04-24 NOTE — Progress Notes (Signed)
Subjective:  Patient ID: Annette Boyer, female    DOB: 05/07/1960  Age: 54 y.o. MRN: [id number]  CC: Leg Pain   HPI Annette Boyer presents for    1. R knee pain: chronic pain. Worsening. Pain is posterior knee. 4/10 at rest. 7/10 with walking. Patient has known DJD. She is taking oral NSAID. She is out of flexeril. She is a smoker.   2. R heel pain: chronic. Has seen a podiatrist and had a steroid injection. No recent treatment.   3. L calf swelling: x 2 months. Patient dropped bedtime on leg. There was significant bruising. Improving slowly. Still slightly swollen. There is rash now that is pruritic where initial bruise was.   Social History  Substance Use Topics  . Smoking status: Heavy Tobacco Smoker -- 0.50 packs/day for 7 years    Last Attempt to Quit: 01/23/2014  . Smokeless tobacco: Not on file  . Alcohol Use: No   Outpatient Prescriptions Prior to Visit  Medication Sig Dispense Refill  . cetirizine (ZYRTEC ALLERGY) 10 MG tablet Take 1 tablet (10 mg total) by mouth daily. 30 tablet 1  . Cetirizine HCl 10 MG CAPS Take 1 capsule (10 mg total) by mouth daily. 30 capsule 2  . cyclobenzaprine (FLEXERIL) 10 MG tablet Take 1 tablet (10 mg total) by mouth 2 (two) times daily as needed for muscle spasms. 20 tablet 0  . DULoxetine (CYMBALTA) 30 MG capsule Take 1 capsule (30 mg total) by mouth daily. 120 capsule 3  . fluticasone (FLONASE) 50 MCG/ACT nasal spray Place 2 sprays into both nostrils daily. 16 g 6  . ibuprofen (ADVIL,MOTRIN) 600 MG tablet Take 1 tablet (600 mg total) by mouth every 8 (eight) hours as needed. 30 tablet 2  . levothyroxine (SYNTHROID, LEVOTHROID) 125 MCG tablet Take 1 tablet (125 mcg total) by mouth daily. 90 tablet 3  . predniSONE (DELTASONE) 20 MG tablet Take 2 tablets (40 mg total) by mouth daily. 20 tablet 0  . traZODone (DESYREL) 50 MG tablet Take 1 tablet (50 mg total) by mouth at bedtime. 10 tablet 0  . Vitamin D, Ergocalciferol, (DRISDOL) 50000  UNITS CAPS capsule TAKE 1 CAPSULE BY MOUTH ONCE A WEEK 12 capsule 0   No facility-administered medications prior to visit.    ROS Review of Systems  Constitutional: Negative for fever and chills.  Eyes: Negative for visual disturbance.  Respiratory: Negative for shortness of breath.   Cardiovascular: Negative for chest pain.  Gastrointestinal: Negative for abdominal pain and blood in stool.  Musculoskeletal: Positive for myalgias and arthralgias. Negative for back pain.  Skin: Positive for rash.  Allergic/Immunologic: Negative for immunocompromised state.  Hematological: Negative for adenopathy. Does not bruise/bleed easily.  Psychiatric/Behavioral: Negative for suicidal ideas and dysphoric mood.    Objective:  BP 110/78 mmHg  Pulse 82  Temp(Src) 99 F (37.2 C) (Oral)  Resp 16  Ht 5\' 3"  (1.6 m)  Wt 217 lb (98.431 kg)  BMI 38.45 kg/m2  SpO2 96%  BP/Weight 04/24/2015 11/08/2014 10/09/2014  Systolic BP 110 110 131  Diastolic BP 78 78 88  Wt. (Lbs) 217 220 -  BMI 38.45 - -   Physical Exam  Constitutional: She is oriented to person, place, and time. She appears well-developed and well-nourished. No distress.  HENT:  Head: Normocephalic and atraumatic.  Cardiovascular: Normal rate, regular rhythm, normal heart sounds and intact distal pulses.   Pulses:      Dorsalis pedis pulses are 2+ on the right side.  Pulmonary/Chest: Effort normal and breath sounds normal.  Musculoskeletal: She exhibits tenderness. She exhibits no edema.       Right knee: She exhibits decreased range of motion, swelling and abnormal patellar mobility. She exhibits no effusion, no ecchymosis, no deformity, no laceration, no erythema, normal alignment, no LCL laxity, no bony tenderness, normal meniscus and no MCL laxity. Tenderness found. Medial joint line tenderness noted. No lateral joint line, no MCL, no LCL and no patellar tendon tenderness noted.       Right ankle: Achilles tendon exhibits pain and defect  (swelling ).       Left lower leg: She exhibits tenderness and swelling. She exhibits no bony tenderness, no edema, no deformity and no laceration.       Legs: Neurological: She is alert and oriented to person, place, and time.  Skin: Skin is warm and dry. No rash noted.  Psychiatric: She has a normal mood and affect.    Assessment & Plan:   Problem List Items Addressed This Visit    Depression (Chronic)   Relevant Medications   QUEtiapine (SEROQUEL) 50 MG tablet   escitalopram (LEXAPRO) 10 MG tablet   mirtazapine (REMERON) 15 MG tablet   History of substance abuse (Chronic)   Hypothyroidism (Chronic)    Due for TSH check TSH ordered       Relevant Medications   levothyroxine (SYNTHROID, LEVOTHROID) 125 MCG tablet   Other Relevant Orders   TSH   Pain of right heel (Chronic)   Relevant Orders   Ambulatory referral to Podiatry   Right hip pain (Chronic)    Podiatry referral       Relevant Medications   cyclobenzaprine (FLEXERIL) 10 MG tablet   Right knee DJD (Chronic)    A: R knee DJD with suspected Baker's cyst P: Steroid injection Oral NSAID Tylenol #3 Weight loss       Relevant Medications   methylPREDNISolone acetate (DEPO-MEDROL) injection 40 mg (Completed)   cyclobenzaprine (FLEXERIL) 10 MG tablet   acetaminophen-codeine (TYLENOL #3) 300-30 MG tablet   Skin rash    Pruritic rash on back of L calf following bruise with slowly resolving hematoma  Steroid cream ordered       Relevant Medications   triamcinolone cream (KENALOG) 0.1 %   Tobacco abuse (Chronic)    Other Visit Diagnoses    Healthcare maintenance    -  Primary    Relevant Orders    Flu Vaccine QUAD 36+ mos IM (Completed)    Need for hepatitis C screening test        Relevant Orders    Hepatitis C antibody, reflex    Screening for HIV (human immunodeficiency virus)        Relevant Orders    HIV antibody (with reflex)       No orders of the defined types were placed in this encounter.     Follow-up: No Follow-up on file.   Dessa Phi MD

## 2015-04-25 LAB — HEPATITIS C ANTIBODY: HCV Ab: REACTIVE — AB

## 2015-04-25 LAB — HIV ANTIBODY (ROUTINE TESTING W REFLEX): HIV: NONREACTIVE

## 2015-04-30 NOTE — Addendum Note (Signed)
Addended by: Dessa PhiFUNCHES, Jaiel Saraceno on: 04/30/2015 02:53 PM   Modules accepted: Orders

## 2015-05-02 DIAGNOSIS — R768 Other specified abnormal immunological findings in serum: Secondary | ICD-10-CM | POA: Insufficient documentation

## 2015-05-02 LAB — HEPATITIS C RNA QUANTITATIVE

## 2015-05-02 MED ORDER — LEVOTHYROXINE SODIUM 100 MCG PO TABS: 100.0000 ug | ORAL_TABLET | Freq: Every day | ORAL | Status: DC

## 2015-05-02 NOTE — Addendum Note (Signed)
Addended by: Dessa PhiFUNCHES, Catheryne Deford on: 05/02/2015 02:13 PM   Modules accepted: Orders

## 2015-05-02 NOTE — Assessment & Plan Note (Signed)
Hep C is positive Will need f/u viral load to determine if there is active virus

## 2015-05-08 ENCOUNTER — Ambulatory Visit: Payer: Self-pay | Attending: Family Medicine

## 2015-05-08 ENCOUNTER — Telehealth: Payer: Self-pay | Admitting: *Deleted

## 2015-05-08 DIAGNOSIS — R768 Other specified abnormal immunological findings in serum: Secondary | ICD-10-CM

## 2015-05-08 MED FILL — ?CETIRIZINE HCL 10 MG TABLE: 10 | 30 days supply | Qty: 30 | Fill #0

## 2015-05-08 MED FILL — DULoxetine HCL 30 MG CPEP: 30 | 30 days supply | Qty: 30 | Fill #5

## 2015-05-08 NOTE — Telephone Encounter (Signed)
Pt here in our clinic for lab result and repeat lab  Lab results given Positive hep C.  TSH low synthroid dose decreased to  Pt verbalized understanding

## 2015-05-08 NOTE — Telephone Encounter (Signed)
-----   Message from Dessa PhiJosalyn Funches, MD sent at 05/02/2015  2:11 PM EST ----- Screening HIV negative  Hep C is positive Will need f/u viral load to determine if there is active virus  TSH is low suggesting synthroid dose is too high, dose decreased to 100 mcg

## 2015-05-10 ENCOUNTER — Telehealth: Payer: Self-pay | Admitting: *Deleted

## 2015-05-10 LAB — HEPATITIS C RNA QUANTITATIVE: HCV Quantitative: NOT DETECTED IU/mL (ref <15)

## 2015-05-10 NOTE — Telephone Encounter (Signed)
-----   Message from Dessa PhiJosalyn Funches, MD sent at 05/10/2015  9:37 AM EST ----- Hep C viral load undetectable

## 2015-05-10 NOTE — Telephone Encounter (Signed)
LVM to return call.

## 2015-05-14 ENCOUNTER — Telehealth: Payer: Self-pay | Admitting: Family Medicine

## 2015-05-14 NOTE — Telephone Encounter (Signed)
Patient put in request to receive a list of active medications and what she takes them for. Please follow up.

## 2015-05-16 ENCOUNTER — Ambulatory Visit: Payer: Self-pay | Attending: Family Medicine

## 2015-05-16 NOTE — Telephone Encounter (Signed)
Pt. Came in today requesting her lab results. Please f/u with pt.

## 2015-05-22 NOTE — Telephone Encounter (Signed)
Date of birth verified by pt  Lab results given Hep C viral load undetectable Pt verbalized understanding

## 2015-05-31 ENCOUNTER — Telehealth: Payer: Self-pay | Admitting: Family Medicine

## 2015-05-31 DIAGNOSIS — E039 Hypothyroidism, unspecified: Secondary | ICD-10-CM

## 2015-05-31 NOTE — Telephone Encounter (Signed)
LVM to patient telling her that she should receive a letter to be at 4:30 pm but the clinic start at 5pm  Thank You

## 2015-05-31 NOTE — Telephone Encounter (Signed)
Patient would like to know the time that they will be having Podiatry clinic on Feb 6.

## 2015-06-10 ENCOUNTER — Ambulatory Visit: Payer: Self-pay | Attending: Podiatry

## 2015-06-10 ENCOUNTER — Other Ambulatory Visit: Payer: Self-pay | Admitting: Podiatry

## 2015-06-10 MED ORDER — DICLOFENAC SODIUM 75 MG PO TBEC: 75.0000 mg | DELAYED_RELEASE_TABLET | Freq: Two times a day (BID) | ORAL | Status: DC

## 2015-06-10 MED FILL — DULoxetine HCL 30 MG CPEP: 30 | 30 days supply | Qty: 30 | Fill #6

## 2015-06-10 MED FILL — ?LEVOTHYROXINE 100 MCG TAB: 100 | 30 days supply | Qty: 30 | Fill #1

## 2015-06-10 MED FILL — ?CETIRIZINE HCL 10 MG TABLE: 10 | 30 days supply | Qty: 30 | Fill #1

## 2015-06-11 MED FILL — ?DICLOFENAC SOD DR 75 MG TA: 75 | 15 days supply | Qty: 30 | Fill #0

## 2015-06-19 ENCOUNTER — Other Ambulatory Visit: Payer: Self-pay | Admitting: Internal Medicine

## 2015-06-19 DIAGNOSIS — M7918 Myalgia, other site: Secondary | ICD-10-CM

## 2015-06-19 MED ORDER — DULOXETINE HCL 30 MG PO CPEP: 30.0000 mg | ORAL_CAPSULE | Freq: Every day | ORAL | Status: AC

## 2015-06-24 ENCOUNTER — Ambulatory Visit: Payer: Self-pay | Admitting: Family Medicine

## 2015-06-27 ENCOUNTER — Encounter (HOSPITAL_COMMUNITY): Payer: Self-pay | Admitting: Emergency Medicine

## 2015-06-27 ENCOUNTER — Emergency Department (INDEPENDENT_AMBULATORY_CARE_PROVIDER_SITE_OTHER)
Admission: EM | Admit: 2015-06-27 | Discharge: 2015-06-27 | Disposition: A | Discharge status: Home / Self Care | Payer: No Typology Code available for payment source | Source: Home / Self Care | Attending: Emergency Medicine | Admitting: Emergency Medicine

## 2015-06-27 ENCOUNTER — Emergency Department (INDEPENDENT_AMBULATORY_CARE_PROVIDER_SITE_OTHER): Payer: No Typology Code available for payment source

## 2015-06-27 DIAGNOSIS — R39198 Other difficulties with micturition: Secondary | ICD-10-CM

## 2015-06-27 DIAGNOSIS — M7989 Other specified soft tissue disorders: Secondary | ICD-10-CM

## 2015-06-27 DIAGNOSIS — K5901 Slow transit constipation: Secondary | ICD-10-CM

## 2015-06-27 LAB — POCT URINALYSIS DIP (DEVICE)
GLUCOSE, UA: NEGATIVE mg/dL
Hgb urine dipstick: NEGATIVE
KETONES UR: NEGATIVE mg/dL
LEUKOCYTES UA: NEGATIVE
Nitrite: NEGATIVE
Protein, ur: NEGATIVE mg/dL
SPECIFIC GRAVITY, URINE: 1.025 (ref 1.005–1.030)
Urobilinogen, UA: 1 mg/dL (ref 0.0–1.0)
pH: 6.5 (ref 5.0–8.0)

## 2015-06-27 MED ORDER — PEG 3350-KCL-NABCB-NACL-NASULF 236 G PO SOLR: ORAL | Status: DC

## 2015-06-27 MED ORDER — FLUCONAZOLE 150 MG PO TABS: 150.0000 mg | ORAL_TABLET | Freq: Once | ORAL | Status: DC

## 2015-06-27 MED ORDER — CEPHALEXIN 500 MG PO CAPS: 500.0000 mg | ORAL_CAPSULE | Freq: Four times a day (QID) | ORAL | Status: DC

## 2015-06-27 NOTE — ED Notes (Signed)
Pt complains of bilateral blisters on the bottom of her feet, leg and arm swelling that makes her "feel like they are going to pop", and urinary retention.  She also reports no BM in 2 weeks even after two enemas today.  She reports issues with her toenails because they keep growing into her skin.  Pt has a blister at the base of her toes bilaterally.  BLE have some swelling, but there is no swelling in her arms.

## 2015-06-27 NOTE — ED Provider Notes (Signed)
CSN: [id number]     Arrival date & time 06/27/15  1902 History   First MD Initiated Contact with Patient 06/27/15 1949     Chief Complaint  Patient presents with  . blisters      BLE  . Constipation  . Urinary Retention  . Leg Swelling  . Nail Problem   (Consider location/radiation/quality/duration/timing/severity/associated sxs/prior Treatment) HPI She is a 55 year old woman here with multiple concerns. She states she has been quite constipated for the last 3 weeks, having 1 bowel movement about 2 weeks ago and several small bowel movements today after 2 enemas. She denies any nausea or vomiting. No abdominal pain. She also reports difficulty urinating for the last 3 weeks. She states when she does go, it is a small amount causes burning. 3 weeks ago, her thyroid medicine was decreased from 139 g 100 g daily. Last night, at work, she had an episode where her feet and legs became very swollen. She states it felt like her skin was about to pop. She states her arms also became swollen. The onset completely resolved, but the legs remains somewhat swollen. She denies any pain this time. She also has some blisters on her feet. She did start a new job recently that involves a lot of walking. She did just buy a new pair of shoes.  Past Medical History  Diagnosis Date  . Thyroid disease    Past Surgical History  Procedure Laterality Date  . Abdominal hysterectomy    . Nasal sinus surgery    . Bladder surgery    . Neck surgery      20 years ago    History reviewed. No pertinent family history. Social History  Substance Use Topics  . Smoking status: Current Every Day Smoker -- 0.50 packs/day for 7 years  . Smokeless tobacco: None  . Alcohol Use: No   OB History    No data available     Review of Systems As in history of present illness Allergies  Bactrim and Penicillins  Home Medications   Prior to Admission medications   Medication Sig Start Date End Date Taking? Authorizing  Provider  diclofenac (VOLTAREN) 75 MG EC tablet Take 1 tablet (75 mg total) by mouth 2 (two) times daily. 06/10/15  Yes Vivi Barrack, DPM  levothyroxine (SYNTHROID, LEVOTHROID) 100 MCG tablet Take 1 tablet (100 mcg total) by mouth daily before breakfast. 05/02/15  Yes Josalyn Funches, MD  mirtazapine (REMERON) 15 MG tablet Take 15 mg by mouth at bedtime.   Yes Historical Provider, MD  QUEtiapine (SEROQUEL) 50 MG tablet Take 50 mg by mouth at bedtime.   Yes Historical Provider, MD  acetaminophen-codeine (TYLENOL #3) 300-30 MG tablet Take 1 tablet by mouth 2 (two) times daily as needed for moderate pain. 04/24/15   Josalyn Funches, MD  cephALEXin (KEFLEX) 500 MG capsule Take 1 capsule (500 mg total) by mouth 4 (four) times daily. 06/27/15   Charm Rings, MD  cetirizine (ZYRTEC ALLERGY) 10 MG tablet Take 1 tablet (10 mg total) by mouth daily. 02/08/14   Doris Cheadle, MD  Cetirizine HCl 10 MG CAPS Take 1 capsule (10 mg total) by mouth daily. 11/09/14   Doris Cheadle, MD  cyclobenzaprine (FLEXERIL) 10 MG tablet Take 1 tablet (10 mg total) by mouth 2 (two) times daily as needed for muscle spasms. 04/24/15   Josalyn Funches, MD  DULoxetine (CYMBALTA) 30 MG capsule Take 1 capsule (30 mg total) by mouth daily. 06/19/15   Olugbemiga  Annitta Needs, MD  escitalopram (LEXAPRO) 10 MG tablet Take 10 mg by mouth daily.    Historical Provider, MD  fluconazole (DIFLUCAN) 150 MG tablet Take 1 tablet (150 mg total) by mouth once. Take 2nd pill if symptoms not completely resolved in 3 days. 06/27/15   Charm Rings, MD  polyethylene glycol (GOLYTELY) 236 g solution Drink a glass every 10 minutes until gone. 06/27/15   Charm Rings, MD  triamcinolone cream (KENALOG) 0.1 % Apply 1 application topically 2 (two) times daily. 04/24/15   Dessa Phi, MD   Meds Ordered and Administered this Visit  Medications - No data to display  BP 155/96 mmHg  Pulse 88  Temp(Src) 98.2 F (36.8 C) (Oral)  Resp 16  SpO2 100% No data  found.   Physical Exam  Constitutional: She is oriented to person, place, and time. She appears well-developed and well-nourished. No distress.  Cardiovascular: Normal rate.   Pulmonary/Chest: Effort normal.  Abdominal: Soft.  Musculoskeletal: She exhibits edema (1+ bilateral).  Neurological: She is alert and oriented to person, place, and time.  Skin:  Several blisters on her plantar feet over the metatarsal heads. Several these are ruptured. No erythema or drainage to suggest infection.    ED Course  Procedures (including critical care time)  Labs Review Labs Reviewed  POCT URINALYSIS DIP (DEVICE)   UA positive for bilirubin and protein.  Imaging Review Dg Abd 1 View  06/27/2015  CLINICAL DATA:  Decreased bowel movements. EXAM: ABDOMEN - 1 VIEW COMPARISON:  None. FINDINGS: Prominent stool volume noted along the length of the colon. No gaseous small bowel dilatation. Visualized bony anatomy is unremarkable. IMPRESSION: Prominent stool volume in keeping with reported clinical history of constipation. Electronically Signed   By: Kennith Center M.D.   On: 06/27/2015 20:24      MDM   1. Slow transit constipation   2. Difficulty urinating   3. Leg swelling    Treatment constipation with GoLYTELY. Keflex for concern for a UTI secondary to constipation. Diflucan to use after antibiotics if needed for yeast infection. Wound care discussed for blisters. I've recommended she call her primary care doctor to get her thyroid levels rechecked as this could be causing her constipation.    Charm Rings, MD 06/27/15 2045

## 2015-06-27 NOTE — Discharge Instructions (Signed)
You are very constipated. This is likely causing your other symptoms. Take GoLYTELY as prescribed. This will cause diarrhea. Take Keflex 4 times a day for 3 days for urine infection. Use the Diflucan if you develop a yeast infection. Soak your feet in Epsom salts to help with the blisters. Apply Neosporin twice a day. Please call your primary care doctor to see about getting your thyroid level rechecked soon as this may be contributing to your constipation.

## 2015-06-28 MED FILL — GOLYTELY SOLUTION: 236 | 1 days supply | Qty: 4000 | Fill #0

## 2015-06-28 MED FILL — FLUCONAZOLE 150 MG TABLET: 150 | 3 days supply | Qty: 2 | Fill #0

## 2015-06-28 MED FILL — CEPHALEXIN 500 MG CAPSULE: 500 | 3 days supply | Qty: 12 | Fill #0

## 2015-06-28 NOTE — Telephone Encounter (Signed)
Pt. Came in today stating that her Tyroid medicine was changed three weeks ago and it made her feel sick. Pt. Went to urgent care on 06/27/15 because she was constipated and had a bladder infection because of the medication. Pt. Was told that the medication was dropped really low. Please f/u with pt. ASAP

## 2015-06-29 ENCOUNTER — Encounter (HOSPITAL_COMMUNITY): Payer: Self-pay | Admitting: *Deleted

## 2015-06-29 ENCOUNTER — Emergency Department (INDEPENDENT_AMBULATORY_CARE_PROVIDER_SITE_OTHER)
Admission: EM | Admit: 2015-06-29 | Discharge: 2015-06-29 | Disposition: A | Discharge status: Home / Self Care | Payer: No Typology Code available for payment source | Source: Home / Self Care | Attending: Emergency Medicine | Admitting: Emergency Medicine

## 2015-06-29 DIAGNOSIS — M7989 Other specified soft tissue disorders: Secondary | ICD-10-CM

## 2015-06-29 LAB — TSH: TSH: 0.245 u[IU]/mL — ABNORMAL LOW (ref 0.350–4.500)

## 2015-06-29 LAB — D-DIMER, QUANTITATIVE (NOT AT ARMC): D DIMER QUANT: 0.91 ug{FEU}/mL — AB (ref 0.00–0.50)

## 2015-06-29 NOTE — Discharge Instructions (Signed)
We are drawing some blood. If the D-dimer test is positive, I will call and have you go to the emergency room for additional evaluation. If it is negative, I will let you know and call in a water pill to help with your swelling. Please set up an appointment with your primary care doctor so she can follow-up the swelling and the results of your thyroid test.

## 2015-06-29 NOTE — ED Provider Notes (Addendum)
CSN: [id number]     Arrival date & time 06/29/15  1413 History   First MD Initiated Contact with Patient 06/29/15 1551     Chief Complaint  Patient presents with  . Edema   (Consider location/radiation/quality/duration/timing/severity/associated sxs/prior Treatment) HPI  She is a 55 year old woman here for follow-up of leg edema. She was seen here 2 days ago and found to have constipation as well as leg swelling at that time. She has been doing the GoLYTELY with good results. She states the leg edema did get a little bit better, but then worsened again last night. The swelling is worse in the left leg. She states her skin feels tight. There is some mild discomfort, primarily in the left leg. She is unable to get her shoes on.  Past Medical History  Diagnosis Date  . Thyroid disease    Past Surgical History  Procedure Laterality Date  . Abdominal hysterectomy    . Nasal sinus surgery    . Bladder surgery    . Neck surgery      20 years ago    No family history on file. Social History  Substance Use Topics  . Smoking status: Current Every Day Smoker -- 0.50 packs/day for 7 years  . Smokeless tobacco: None  . Alcohol Use: No   OB History    No data available     Review of Systems As in history of present illness Allergies  Bactrim and Penicillins  Home Medications   Prior to Admission medications   Medication Sig Start Date End Date Taking? Authorizing Provider  DULoxetine (CYMBALTA) 30 MG capsule Take 1 capsule (30 mg total) by mouth daily. 06/19/15  Yes Quentin Angst, MD  escitalopram (LEXAPRO) 10 MG tablet Take 10 mg by mouth daily.   Yes Historical Provider, MD  levothyroxine (SYNTHROID, LEVOTHROID) 100 MCG tablet Take 1 tablet (100 mcg total) by mouth daily before breakfast. 05/02/15  Yes Josalyn Funches, MD  mirtazapine (REMERON) 15 MG tablet Take 15 mg by mouth at bedtime.   Yes Historical Provider, MD  polyethylene glycol (GOLYTELY) 236 g solution Drink a  glass every 10 minutes until gone. 06/27/15  Yes Charm Rings, MD  QUEtiapine (SEROQUEL) 50 MG tablet Take 50 mg by mouth at bedtime.   Yes Historical Provider, MD  acetaminophen-codeine (TYLENOL #3) 300-30 MG tablet Take 1 tablet by mouth 2 (two) times daily as needed for moderate pain. 04/24/15   Josalyn Funches, MD  cephALEXin (KEFLEX) 500 MG capsule Take 1 capsule (500 mg total) by mouth 4 (four) times daily. 06/27/15   Charm Rings, MD  cetirizine (ZYRTEC ALLERGY) 10 MG tablet Take 1 tablet (10 mg total) by mouth daily. 02/08/14   Doris Cheadle, MD  Cetirizine HCl 10 MG CAPS Take 1 capsule (10 mg total) by mouth daily. 11/09/14   Doris Cheadle, MD  cyclobenzaprine (FLEXERIL) 10 MG tablet Take 1 tablet (10 mg total) by mouth 2 (two) times daily as needed for muscle spasms. 04/24/15   Dessa Phi, MD  diclofenac (VOLTAREN) 75 MG EC tablet Take 1 tablet (75 mg total) by mouth 2 (two) times daily. 06/10/15   Vivi Barrack, DPM  fluconazole (DIFLUCAN) 150 MG tablet Take 1 tablet (150 mg total) by mouth once. Take 2nd pill if symptoms not completely resolved in 3 days. 06/27/15   Charm Rings, MD  triamcinolone cream (KENALOG) 0.1 % Apply 1 application topically 2 (two) times daily. 04/24/15   Dessa Phi, MD  Meds Ordered and Administered this Visit  Medications - No data to display  BP 148/87 mmHg  Pulse 92  Temp(Src) 97.9 F (36.6 C)  SpO2 99% No data found.   Physical Exam  Constitutional: She is oriented to person, place, and time. She appears well-developed and well-nourished.  Cardiovascular: Normal rate.   Pulmonary/Chest: Effort normal.  Musculoskeletal:  She has trace pitting edema bilaterally. The left leg does appear more swollen than the right. Right calf measures 40 cm, left calf is 42 cm. 2+ DP pulses bilaterally. Focal tenderness to left calf at site of old bruise.  Neurological: She is alert and oriented to person, place, and time.    ED Course  Procedures  (including critical care time)  Labs Review Labs Reviewed  TSH  D-DIMER, QUANTITATIVE (NOT AT [facility])    Imaging Review Dg Abd 1 View  06/27/2015  CLINICAL DATA:  Decreased bowel movements. EXAM: ABDOMEN - 1 VIEW COMPARISON:  None. FINDINGS: Prominent stool volume noted along the length of the colon. No gaseous small bowel dilatation. Visualized bony anatomy is unremarkable. IMPRESSION: Prominent stool volume in keeping with reported clinical history of constipation. Electronically Signed   By: Kennith Center M.D.   On: 06/27/2015 20:24      MDM   1. Leg swelling    As she had some difficulty getting her thyroid level rechecked, I will go ahead and draw a TSH here so is available for her PCP. As there is some asymmetry in her legs, will check a d-dimer. I will call her with the result. If it is positive, she will need to go to the emergency room for DVT evaluation. If it is negative, I will call in some Lasix for her to use on an as-needed basis. Recommended scheduling an appointment with her PCP to follow-up the swelling and her thyroid result.    Charm Rings, MD 06/29/15 1652   D-dimer came back elevated at 0.91.  I have called the patient and discussed these results with her.  I have recommended that she go to the emergency room to r/o DVT in the left leg.  I have relatively low suspicion for DVT.  Patient asked if it would be okay to go first thing in the morning. Given her relatively low risk (and she likely wouldn't be able to get an ultrasound until tomorrow) I have told her that is okay.  Discussed that I would not call in diuretics at this time as we need to r/o a DVT first.  All questions were answered.     Charm Rings, MD 06/29/15 2312

## 2015-06-29 NOTE — ED Notes (Signed)
Was seen 2 days ago for constipation and BLE edema (which had improved at time of eval).  Was prescribed Golightly - has been taking with good results; was also prescribed abx - pt has not started abx yet.  Last night BLE starting swelling more again; upon waking this morning they were worse; L>R.  Also c/o BUE swelling, though to a lesser extent.

## 2015-06-30 ENCOUNTER — Encounter (HOSPITAL_COMMUNITY): Payer: Self-pay | Admitting: Emergency Medicine

## 2015-06-30 ENCOUNTER — Emergency Department (HOSPITAL_COMMUNITY)
Admission: EM | Admit: 2015-06-30 | Discharge: 2015-06-30 | Disposition: A | Discharge status: Home / Self Care | Payer: Self-pay | Attending: Emergency Medicine | Admitting: Emergency Medicine

## 2015-06-30 ENCOUNTER — Emergency Department (EMERGENCY_DEPARTMENT_HOSPITAL): Payer: Self-pay

## 2015-06-30 DIAGNOSIS — R609 Edema, unspecified: Secondary | ICD-10-CM

## 2015-06-30 DIAGNOSIS — Z79899 Other long term (current) drug therapy: Secondary | ICD-10-CM | POA: Insufficient documentation

## 2015-06-30 DIAGNOSIS — F17200 Nicotine dependence, unspecified, uncomplicated: Secondary | ICD-10-CM | POA: Insufficient documentation

## 2015-06-30 DIAGNOSIS — Z880 Allergy status to penicillin: Secondary | ICD-10-CM | POA: Insufficient documentation

## 2015-06-30 DIAGNOSIS — R600 Localized edema: Secondary | ICD-10-CM | POA: Insufficient documentation

## 2015-06-30 DIAGNOSIS — Z791 Long term (current) use of non-steroidal anti-inflammatories (NSAID): Secondary | ICD-10-CM | POA: Insufficient documentation

## 2015-06-30 DIAGNOSIS — M79605 Pain in left leg: Secondary | ICD-10-CM | POA: Insufficient documentation

## 2015-06-30 DIAGNOSIS — R7989 Other specified abnormal findings of blood chemistry: Secondary | ICD-10-CM | POA: Insufficient documentation

## 2015-06-30 DIAGNOSIS — M7989 Other specified soft tissue disorders: Secondary | ICD-10-CM

## 2015-06-30 DIAGNOSIS — E079 Disorder of thyroid, unspecified: Secondary | ICD-10-CM | POA: Insufficient documentation

## 2015-06-30 DIAGNOSIS — Z792 Long term (current) use of antibiotics: Secondary | ICD-10-CM | POA: Insufficient documentation

## 2015-06-30 MED ORDER — FUROSEMIDE 20 MG PO TABS: 20.0000 mg | ORAL_TABLET | Freq: Every day | ORAL | Status: DC | PRN

## 2015-06-30 NOTE — ED Notes (Signed)
Patient transported to Ultrasound 

## 2015-06-30 NOTE — ED Notes (Addendum)
Pt from home with report of increased D-dimer per PCP of 0.91 last night.  Pt reports left leg pain and swelling x "a while" worsening with a new job starting this past week.  Pt reports she has had a "knot" in the back of her leg x 4 months since object fell into it.  NAD, ambulatory, A&O.

## 2015-06-30 NOTE — Progress Notes (Signed)
VASCULAR LAB PRELIMINARY  PRELIMINARY  PRELIMINARY  PRELIMINARY  Left lower extremity venous duplex completed.    Preliminary report:  Left:  No evidence of DVT, superficial thrombosis, or Baker's cyst.  Jastin Fore, RVS 06/30/2015, 1:23 PM

## 2015-06-30 NOTE — ED Provider Notes (Signed)
CSN: [id number]     Arrival date & time 06/30/15  0944 History   First MD Initiated Contact with Patient 06/30/15 1022     Chief Complaint  Patient presents with  . Abnormal Lab  . Leg Pain   HPI  Annette Boyer is a 55 year old female with PMHx of hypothyroidism presenting with an elevated D dimer and intermittent left lower leg pain and swelling. She reports a history of mild bilateral leg swelling for "awhile". She notes it has been many months since onset of leg swelling. She states that she recently started a new job where she is on her feet frequently. Over the past 2 weeks, she has noticed increased lower leg swelling after work. She notes that being on her feet exacerbates the swelling and resting from work alleviates it. She believes that her left leg gets more swollen than her right. She states that her skin feels tight like it is about to pop. She also notes an area of tenderness over the left posterior calf. She states that a bed frame fell on her leg a few months ago and she has had pain in her left calf ever since. She reports a "knot" that comes and goes in her calf. She states that the pain in her leg is intermittent and she is not currently experiencing it. She was seen by an urgent care provider yesterday for the leg swelling complaint. Dr. Piedad Climes performed a d-dimer which was found to be elevated at 0.91. Patient denies associated chest pain, palpitations or shortness of breath. She has no other complaints today.  Past Medical History  Diagnosis Date  . Thyroid disease    Past Surgical History  Procedure Laterality Date  . Abdominal hysterectomy    . Nasal sinus surgery    . Bladder surgery    . Neck surgery      20 years ago    History reviewed. No pertinent family history. Social History  Substance Use Topics  . Smoking status: Current Every Day Smoker -- 0.50 packs/day for 7 years  . Smokeless tobacco: None  . Alcohol Use: No   OB History    No data available      Review of Systems  Respiratory: Negative for cough and shortness of breath.   Cardiovascular: Positive for leg swelling. Negative for chest pain.  Musculoskeletal: Positive for myalgias.  All other systems reviewed and are negative.     Allergies  Bactrim and Penicillins  Home Medications   Prior to Admission medications   Medication Sig Start Date End Date Taking? Authorizing Provider  Cetirizine HCl 10 MG CAPS Take 1 capsule (10 mg total) by mouth daily. 11/09/14  Yes Doris Cheadle, MD  diclofenac (VOLTAREN) 75 MG EC tablet Take 1 tablet (75 mg total) by mouth 2 (two) times daily. 06/10/15  Yes Vivi Barrack, DPM  DULoxetine (CYMBALTA) 30 MG capsule Take 1 capsule (30 mg total) by mouth daily. 06/19/15  Yes Quentin Angst, MD  escitalopram (LEXAPRO) 10 MG tablet Take 10 mg by mouth daily.   Yes Historical Provider, MD  levothyroxine (SYNTHROID, LEVOTHROID) 100 MCG tablet Take 1 tablet (100 mcg total) by mouth daily before breakfast. 05/02/15  Yes Josalyn Funches, MD  mirtazapine (REMERON) 15 MG tablet Take 15 mg by mouth at bedtime.   Yes Historical Provider, MD  QUEtiapine (SEROQUEL) 50 MG tablet Take 50 mg by mouth at bedtime.   Yes Historical Provider, MD  cephALEXin (KEFLEX) 500 MG capsule  Take 1 capsule (500 mg total) by mouth 4 (four) times daily. 06/27/15   Charm Rings, MD  fluconazole (DIFLUCAN) 150 MG tablet Take 1 tablet (150 mg total) by mouth once. Take 2nd pill if symptoms not completely resolved in 3 days. 06/27/15   Charm Rings, MD   BP 138/85 mmHg  Pulse 84  Temp(Src) 98.2 F (36.8 C) (Oral)  Resp 20  SpO2 96% Physical Exam  Constitutional: She appears well-developed and well-nourished. No distress.  HENT:  Head: Normocephalic and atraumatic.  Eyes: Conjunctivae are normal. Right eye exhibits no discharge. Left eye exhibits no discharge. No scleral icterus.  Neck: Normal range of motion.  Cardiovascular: Normal rate, regular rhythm and intact distal  pulses.   Pedal pulses palpable b/l. Cap refill < 3 seconds  Pulmonary/Chest: Effort normal and breath sounds normal. No respiratory distress. She has no wheezes. She has no rales.  Abdominal: Soft. She exhibits no distension. There is no tenderness.  Musculoskeletal: Normal range of motion.  FROM of hip, knee, ankle and toes bilaterally. Walks with a steady gait. Minimal bilateral pitting edema most prominent in the ankles. No pretibial pitting edema. Left calf appears slightly larger than the right. No tenderness over bilateral lower extremities. No induration or palpable cords of left calf. Negative Homan's.   Neurological: She is alert. Coordination normal.  5/5 ankle strength bilaterally. Sensation to light touch intact over BLE  Skin: Skin is warm and dry. No erythema.  No erythema of left calf.   Psychiatric: She has a normal mood and affect. Her behavior is normal.  Nursing note and vitals reviewed.   ED Course  Procedures (including critical care time) Labs Review Labs Reviewed - No data to display  Imaging Review No results found. I have personally reviewed and evaluated these images and lab results as part of my medical decision-making.   EKG Interpretation None      MDM   Final diagnoses:  Pain of left lower extremity  Peripheral edema   55 year old female presenting with multiple months of bilateral peripheral edema with left greater than right swelling of the calf. Also complaining of persistent posterior calf pain on the left after a bed frame struck her. Currently states she is not having any pain. Seen by urgent care yesterday with an elevated d-dimer of 0.91. Sent to emergency department for DVT rule out. Patient is nontoxic-appearing. Afebrile without tachycardia or decreased O2 saturations. Minimal pitting edema bilaterally over the ankles. Left leg appears slightly larger than right. No tenderness at left calf. Negative Homans. Bilateral extremities are  neurovascularly intact with full range of motion. DVT study negative. Patient is to follow-up with her primary care provider for further management of her chronic peripheral edema. Encouraged patient to use compression stockings for symptom control. No further evaluation necessary at this time. Return precautions given in discharge paperwork and discussed with pt at bedside. Pt stable for discharge   Alveta Heimlich, PA-C 06/30/15 1427  Bethann Berkshire, MD 06/30/15 1555

## 2015-06-30 NOTE — Discharge Instructions (Signed)
Your DVT study was negative today. Follow up with your PCP as planned for further treatment of your chronic leg swelling. Buy compression stockings which will help with your lower extremity swelling. Return to ED with new, worsening or concerning symptoms.    Peripheral Edema You have swelling in your legs (peripheral edema). This swelling is due to excess accumulation of salt and water in your body. Edema may be a sign of heart, kidney or liver disease, or a side effect of a medication. It may also be due to problems in the leg veins. Elevating your legs and using special support stockings may be very helpful, if the cause of the swelling is due to poor venous circulation. Avoid long periods of standing, whatever the cause. Treatment of edema depends on identifying the cause. Chips, pretzels, pickles and other salty foods should be avoided. Restricting salt in your diet is almost always needed. Water pills (diuretics) are often used to remove the excess salt and water from your body via urine. These medicines prevent the kidney from reabsorbing sodium. This increases urine flow. Diuretic treatment may also result in lowering of potassium levels in your body. Potassium supplements may be needed if you have to use diuretics daily. Daily weights can help you keep track of your progress in clearing your edema. You should call your caregiver for follow up care as recommended. SEEK IMMEDIATE MEDICAL CARE IF:   You have increased swelling, pain, redness, or heat in your legs.  You develop shortness of breath, especially when lying down.  You develop chest or abdominal pain, weakness, or fainting.  You have a fever.   This information is not intended to replace advice given to you by your health care provider. Make sure you discuss any questions you have with your health care provider.   Document Released: 05/28/2004 Document Revised: 07/13/2011 Document Reviewed: 10/31/2014 Elsevier Interactive Patient  Education [employer]

## 2015-07-01 ENCOUNTER — Telehealth (HOSPITAL_COMMUNITY): Payer: Self-pay | Admitting: Emergency Medicine

## 2015-07-01 MED ORDER — LEVOTHYROXINE SODIUM 112 MCG PO TABS: 112.0000 ug | ORAL_TABLET | Freq: Every day | ORAL | Status: DC

## 2015-07-01 MED FILL — ?FUROSEMIDE 20 MG TABLET: 20 | 30 days supply | Qty: 30 | Fill #0

## 2015-07-01 MED FILL — ?LEVOTHYROXINE 112 MCG TAB: 112 | 30 days supply | Qty: 30 | Fill #0

## 2015-07-01 NOTE — Telephone Encounter (Signed)
Please call back to patient Synthroid dose increased from 100 to 112 mcg.

## 2015-07-01 NOTE — ED Notes (Signed)
x1 Attempt  Spoke w/pt but call dropped.... Called back and LM on pt's VM [phone] Need to give lab results from recent visit on 2/25  Per Dr. Dayton Scrape,  Referred for leg doppler by Dr Piedad Climes, for elevated D dimer. Preliminary report of leg doppler from earlier today is negative. LM  Will try later.

## 2015-07-02 NOTE — Telephone Encounter (Signed)
Pt notified Rx Synthroid change from 100 to Rx send to [facility]  Pt verbalized understanding

## 2015-07-04 NOTE — ED Notes (Signed)
x2 Attempt  Called pt pt was hanged up on Need to give lab results from recent visit on 2/25  Per Dr. Dayton Scrape,  Referred for leg doppler by Dr Piedad Climes, for elevated D dimer. Preliminary report of leg doppler from earlier today is negative. LM  Mailed letter as x3 attempt

## 2015-07-09 MED FILL — ?CETIRIZINE HCL 10 MG TABLE: 10 | 30 days supply | Qty: 30 | Fill #2

## 2015-07-09 MED FILL — ?DICLOFENAC SOD DR 75 MG TA: 75 | 15 days supply | Qty: 30 | Fill #1

## 2015-07-09 MED FILL — DULoxetine HCL 30 MG CPEP: 30 | 30 days supply | Qty: 30 | Fill #7

## 2015-07-11 ENCOUNTER — Encounter (HOSPITAL_COMMUNITY): Payer: Self-pay | Admitting: *Deleted

## 2015-07-11 ENCOUNTER — Emergency Department (INDEPENDENT_AMBULATORY_CARE_PROVIDER_SITE_OTHER)
Admission: EM | Admit: 2015-07-11 | Discharge: 2015-07-11 | Disposition: A | Discharge status: Home / Self Care | Payer: No Typology Code available for payment source | Source: Home / Self Care | Attending: Emergency Medicine | Admitting: Emergency Medicine

## 2015-07-11 DIAGNOSIS — R238 Other skin changes: Secondary | ICD-10-CM

## 2015-07-11 NOTE — ED Notes (Signed)
Pt  Seen  11  Days  Ago   For  Poss dvt      Was  Seen in  Er       Pt  Reports   Symptoms    Of swelling of  Feet  Rash     And    Bruising  Of r  Arm   Pt   Had   R/o  For  dvt        And  Was  Sent  To the  Er              And      Was     Worked  Up   For  Same

## 2015-07-11 NOTE — ED Provider Notes (Signed)
CSN: [id number]     Arrival date & time 07/11/15  1354 History   First MD Initiated Contact with Patient 07/11/15 1421     Chief Complaint  Patient presents with  . Follow-up   (Consider location/radiation/quality/duration/timing/severity/associated sxs/prior Treatment) HPI History obtained from patient:  Pt presents with the cc of: blisters on both feet Duration of symptoms:several weeks Treatment prior to arrival: popping blisters, visit to UC x2.  Previous symptoms of this type before:yes Pain score:4 Other symptoms include:yeast rash abdomen, bruise right arm  Past Medical History  Diagnosis Date  . Thyroid disease    Past Surgical History  Procedure Laterality Date  . Abdominal hysterectomy    . Nasal sinus surgery    . Bladder surgery    . Neck surgery      20 years ago    History reviewed. No pertinent family history. Social History  Substance Use Topics  . Smoking status: Current Every Day Smoker -- 0.50 packs/day for 7 years  . Smokeless tobacco: None  . Alcohol Use: No   OB History    No data available     Review of Systems Blisters both feet Allergies  Bactrim and Penicillins  Home Medications   Prior to Admission medications   Medication Sig Start Date End Date Taking? Authorizing Provider  cephALEXin (KEFLEX) 500 MG capsule Take 1 capsule (500 mg total) by mouth 4 (four) times daily. 06/27/15   Charm Rings, MD  Cetirizine HCl 10 MG CAPS Take 1 capsule (10 mg total) by mouth daily. 11/09/14   Doris Cheadle, MD  diclofenac (VOLTAREN) 75 MG EC tablet Take 1 tablet (75 mg total) by mouth 2 (two) times daily. 06/10/15   Vivi Barrack, DPM  DULoxetine (CYMBALTA) 30 MG capsule Take 1 capsule (30 mg total) by mouth daily. 06/19/15   Quentin Angst, MD  escitalopram (LEXAPRO) 10 MG tablet Take 10 mg by mouth daily.    Historical Provider, MD  fluconazole (DIFLUCAN) 150 MG tablet Take 1 tablet (150 mg total) by mouth once. Take 2nd pill if symptoms not  completely resolved in 3 days. 06/27/15   Charm Rings, MD  furosemide (LASIX) 20 MG tablet Take 1 tablet (20 mg total) by mouth daily as needed (swelling). 06/30/15   Charm Rings, MD  levothyroxine (SYNTHROID, LEVOTHROID) 112 MCG tablet Take 1 tablet (112 mcg total) by mouth daily. 07/01/15   Josalyn Funches, MD  mirtazapine (REMERON) 15 MG tablet Take 15 mg by mouth at bedtime.    Historical Provider, MD  QUEtiapine (SEROQUEL) 50 MG tablet Take 50 mg by mouth at bedtime.    Historical Provider, MD   Meds Ordered and Administered this Visit  Medications - No data to display  BP 148/82 mmHg  Pulse 78  Temp(Src) 98.6 F (37 C) (Oral)  Resp 18  SpO2 98% No data found.   Physical Exam Physical Exam  alert HENT:  Right Ear: Tympanic membrane normal.  Left Ear: Tympanic membrane normal.  Nose: Nose normal.  Mouth/Throat: Mucous membranes are moist. Oropharynx is clear.  Eyes: Conjunctivae are normal.  Cardiovascular: Regular rhythm.   Pulmonary/Chest: Effort normal and breath sounds normal.  Abdominal: Soft. Bowel sounds are normal.  Neurological: She is alert.  Skin: Skin is warm and dry. No rash noted.  Nursing note and vitals reviewed.  Both feet have dried blisters plantar area of great toe and 4 th toe both feet. No signs of infection.   ED Course  Procedures (including critical care time)  Labs Review Labs Reviewed - No data to display  Imaging Review No results found.   Visual Acuity Review  Right Eye Distance:   Left Eye Distance:   Bilateral Distance:    Right Eye Near:   Left Eye Near:    Bilateral Near:        Pt inquired type of shoe she should wear. Advice to wear a well fitting comfortable shoe.  MDM   1. Blisters of multiple sites    Patient is reassured that there are no issues that require transfer to higher level of care at this time.  Patient is advised to continue home symptomatic treatment. Prescription is sent to  pharmacy patient has  indicated.  Patient is advised that if there are new or worsening symptoms or attend the emergency department, or contact primary care provider. Instructions of care provided discharged home in stable condition. Return to work/school note provided.  THIS NOTE WAS GENERATED USING A VOICE RECOGNITION SOFTWARE PROGRAM. ALL REASONABLE EFFORTS  WERE MADE TO PROOFREAD THIS DOCUMENT FOR ACCURACY.     Tharon AquasFrank C Patrick, PA 07/11/15 1727

## 2015-07-11 NOTE — Discharge Instructions (Signed)
Rest  Keep feet elevated.   Get proper work shoes  See your doctor about diabetes and blood testing.

## 2015-07-19 ENCOUNTER — Encounter: Payer: Self-pay | Admitting: Family Medicine

## 2015-07-19 ENCOUNTER — Ambulatory Visit: Payer: No Typology Code available for payment source | Attending: Family Medicine | Admitting: Family Medicine

## 2015-07-19 ENCOUNTER — Encounter: Payer: Self-pay | Admitting: Clinical

## 2015-07-19 VITALS — BP 130/85 | HR 87 | Temp 98.0°F | Resp 15 | Ht 63.0 in | Wt 216.6 lb

## 2015-07-19 DIAGNOSIS — Z0000 Encounter for general adult medical examination without abnormal findings: Secondary | ICD-10-CM

## 2015-07-19 DIAGNOSIS — R21 Rash and other nonspecific skin eruption: Secondary | ICD-10-CM

## 2015-07-19 DIAGNOSIS — M7989 Other specified soft tissue disorders: Secondary | ICD-10-CM | POA: Insufficient documentation

## 2015-07-19 DIAGNOSIS — D649 Anemia, unspecified: Secondary | ICD-10-CM

## 2015-07-19 DIAGNOSIS — Z131 Encounter for screening for diabetes mellitus: Secondary | ICD-10-CM

## 2015-07-19 DIAGNOSIS — F419 Anxiety disorder, unspecified: Secondary | ICD-10-CM | POA: Insufficient documentation

## 2015-07-19 DIAGNOSIS — Z79899 Other long term (current) drug therapy: Secondary | ICD-10-CM | POA: Insufficient documentation

## 2015-07-19 DIAGNOSIS — F17200 Nicotine dependence, unspecified, uncomplicated: Secondary | ICD-10-CM | POA: Insufficient documentation

## 2015-07-19 DIAGNOSIS — F329 Major depressive disorder, single episode, unspecified: Secondary | ICD-10-CM | POA: Insufficient documentation

## 2015-07-19 LAB — CBC
HCT: 31.8 % — ABNORMAL LOW (ref 36.0–46.0)
Hemoglobin: 10.3 g/dL — ABNORMAL LOW (ref 12.0–15.0)
MCH: 26.4 pg (ref 26.0–34.0)
MCHC: 32.4 g/dL (ref 30.0–36.0)
MCV: 81.5 fL (ref 78.0–100.0)
MPV: 10 fL (ref 8.6–12.4)
PLATELETS: 231 10*3/uL (ref 150–400)
RBC: 3.9 MIL/uL (ref 3.87–5.11)
RDW: 17 % — ABNORMAL HIGH (ref 11.5–15.5)
WBC: 5 10*3/uL (ref 4.0–10.5)

## 2015-07-19 LAB — BASIC METABOLIC PANEL
BUN: 11 mg/dL (ref 7–25)
CHLORIDE: 106 mmol/L (ref 98–110)
CO2: 25 mmol/L (ref 20–31)
Calcium: 9.1 mg/dL (ref 8.6–10.4)
Creat: 0.84 mg/dL (ref 0.50–1.05)
Glucose, Bld: 94 mg/dL (ref 65–99)
Potassium: 5.1 mmol/L (ref 3.5–5.3)
Sodium: 139 mmol/L (ref 135–146)

## 2015-07-19 LAB — GLUCOSE, POCT (MANUAL RESULT ENTRY): POC Glucose: 117 mg/dl — AB (ref 70–99)

## 2015-07-19 LAB — C-REACTIVE PROTEIN

## 2015-07-19 LAB — POCT GLYCOSYLATED HEMOGLOBIN (HGB A1C): HEMOGLOBIN A1C: 5.5

## 2015-07-19 MED ORDER — KETOCONAZOLE 2 % EX CREA: 1.0000 "application " | TOPICAL_CREAM | Freq: Every day | CUTANEOUS | Status: DC

## 2015-07-19 MED ORDER — TRIAMCINOLONE ACETONIDE 0.5 % EX OINT: 1.0000 "application " | TOPICAL_OINTMENT | Freq: Two times a day (BID) | CUTANEOUS | Status: DC

## 2015-07-19 MED ORDER — TRAMADOL HCL 50 MG PO TABS: 50.0000 mg | ORAL_TABLET | Freq: Three times a day (TID) | ORAL | Status: DC | PRN

## 2015-07-19 MED FILL — TRIAMCINOLONE 0.5% OINTMENT: 0.5 | 15 days supply | Qty: 30 | Fill #0

## 2015-07-19 MED FILL — KETOCONAZOLE 2% CREAM: 2 | 15 days supply | Qty: 60 | Fill #0

## 2015-07-19 MED FILL — traMADol HCL 50 MG TABS: 50 | 20 days supply | Qty: 60 | Fill #0

## 2015-07-19 NOTE — Progress Notes (Signed)
Patient states she has been to Urgent Care 4 times in 3 weeks regarding painful, burning skin infection on feet and hands She was told that her PCP needed to screen for DM2 as they were concerned she might be diabetic

## 2015-07-19 NOTE — Progress Notes (Signed)
Dep[facility] 07/19/2015 04/24/2015  Decreased Interest 3 1  Down, Depressed, Hopeless 3 2  PHQ - 2 Score 6 3  Altered sleeping 3 3  Tired, decreased energy 3 3  Change in appetite 3 1  Feeling bad or failure about yourself  3 3  Trouble concentrating 3 3  Moving slowly or fidgety/restless 1 2  Suicidal thoughts 0 0  PHQ-9 Score 22 18    GAD 7 : Generalized Anxiety Score 07/19/2015 04/24/2015  Nervous, Anxious, on Edge 3 3  Control/stop worrying 3 3  Worry too much - different things 3 3  Trouble relaxing 3 3  Restless 3 3  Easily annoyed or irritable 3 2  Afraid - awful might happen 3 1  Total GAD 7 Score 21 18

## 2015-07-19 NOTE — Assessment & Plan Note (Signed)
A; severely xerotic skin with fissuring and cracking in areas. Evidence of excoriation. DDx lichen sclerosis, lichen simplex chronicus, tinea, psoriasis.  P: Fungal cream on feet and hands Steroid cream on body Labs per orders Derm referral

## 2015-07-19 NOTE — Progress Notes (Signed)
Subjective:  Patient ID: Annette Boyer, female    DOB: 1961-01-12  Age: 55 y.o. MRN: [id number]  CC: Rash   HPI Annette Boyer has chronic anxiety and depression,   1.  Rash: x 3 weeks. Started on feet, then hands. Now also on arms, trunk and back. She has excessively dry skin. She has cracking and skin peeling on hands and feet. She denies new medications or skin products. She feels that symptoms started when I decreased her synthroid dose due to TSH being low. Her feet are the most bothersome with thick cracks that are painful. Tylenol #3 has not helped with her pain.   2. Leg swelling: along with rash she also had leg swelling and cramping. The swelling has improved. The cramping persist. She had elevated D dimer in UC. She was sent of venous doppler which was negative. She took lasix for a few weeks which helped with the swelling.   Social History  Substance Use Topics  . Smoking status: Current Every Day Smoker -- 0.50 packs/day for 7 years  . Smokeless tobacco: Not on file  . Alcohol Use: No    Outpatient Prescriptions Prior to Visit  Medication Sig Dispense Refill  . Cetirizine HCl 10 MG CAPS Take 1 capsule (10 mg total) by mouth daily. 30 capsule 2  . diclofenac (VOLTAREN) 75 MG EC tablet Take 1 tablet (75 mg total) by mouth 2 (two) times daily. 30 tablet 1  . DULoxetine (CYMBALTA) 30 MG capsule Take 1 capsule (30 mg total) by mouth daily. 120 capsule 3  . escitalopram (LEXAPRO) 10 MG tablet Take 10 mg by mouth daily.    . fluconazole (DIFLUCAN) 150 MG tablet Take 1 tablet (150 mg total) by mouth once. Take 2nd pill if symptoms not completely resolved in 3 days. 2 tablet 0  . levothyroxine (SYNTHROID, LEVOTHROID) 112 MCG tablet Take 1 tablet (112 mcg total) by mouth daily. 30 tablet 5  . mirtazapine (REMERON) 15 MG tablet Take 15 mg by mouth at bedtime.    Marland Kitchen QUEtiapine (SEROQUEL) 50 MG tablet Take 50 mg by mouth at bedtime.    . cephALEXin (KEFLEX) 500 MG capsule Take 1  capsule (500 mg total) by mouth 4 (four) times daily. (Patient not taking: Reported on 07/19/2015) 12 capsule 0  . furosemide (LASIX) 20 MG tablet Take 1 tablet (20 mg total) by mouth daily as needed (swelling). (Patient not taking: Reported on 07/19/2015) 30 tablet 0   No facility-administered medications prior to visit.    ROS Review of Systems  Constitutional: Negative for fever and chills.  Eyes: Negative for visual disturbance.  Respiratory: Positive for cough. Negative for shortness of breath.   Cardiovascular: Negative for chest pain.  Gastrointestinal: Negative for abdominal pain and blood in stool.  Musculoskeletal: Positive for myalgias, arthralgias and gait problem. Negative for back pain.  Skin: Positive for rash.  Allergic/Immunologic: Negative for immunocompromised state.  Hematological: Negative for adenopathy. Does not bruise/bleed easily.  Psychiatric/Behavioral: Positive for sleep disturbance and dysphoric mood. Negative for suicidal ideas. The patient is nervous/anxious.     Objective:  BP 130/85 mmHg  Pulse 87  Temp(Src) 98 F (36.7 C)  Resp 15  Ht  (1.6 m)  Wt 216 lb 9.6 oz (98.249 kg)  BMI 38.38 kg/m2  SpO2 99%  BP/Weight 07/19/2015 07/11/2015 06/30/2015  Systolic BP 130 148 130  Diastolic BP 85 82 93  Wt. (Lbs) 216.6 - -  BMI 38.38 - -   Wt  Readings from Last 3 Encounters:  07/19/15 216 lb 9.6 oz (98.249 kg)  04/24/15 217 lb (98.431 kg)  11/08/14 220 lb (99.791 kg)     Physical Exam  Constitutional: She is oriented to person, place, and time. She appears well-developed and well-nourished. No distress.  HENT:  Head: Normocephalic and atraumatic.  Cardiovascular: Normal rate, regular rhythm, normal heart sounds and intact distal pulses.   Pulmonary/Chest: Effort normal and breath sounds normal.  Musculoskeletal: She exhibits no edema.  Neurological: She is alert and oriented to person, place, and time.  Skin: Skin is warm and dry. No rash noted.      Psychiatric: She has a normal mood and affect.    Lab Results  Component Value Date   HGBA1C 5.5 07/19/2015    Assessment & Plan:   [name] was seen today for rash.  Diagnoses and all orders for this visit:  Diabetes mellitus screening -     HgB A1c -     Glucose (CBG)  Skin rash -     traMADol (ULTRAM) 50 MG tablet; Take 1 tablet (50 mg total) by mouth every 8 (eight) hours as needed. -     Ambulatory referral to Dermatology -     CBC -     Basic Metabolic Panel -     Sedimentation Rate -     C-reactive protein -     ketoconazole (NIZORAL) 2 % cream; Apply 1 application topically daily. Apply to hands and feet -     triamcinolone ointment (KENALOG) 0.5 %; Apply 1 application topically 2 (two) times daily. Apply to body     No orders of the defined types were placed in this encounter.    Follow-up: No Follow-up on file.   Dessa PhiJosalyn Erlean Mealor MD

## 2015-07-19 NOTE — Patient Instructions (Signed)
Annette Boyer was seen today for rash.  Diagnoses and all orders for this visit:  Diabetes mellitus screening -     HgB A1c -     Glucose (CBG)  Skin rash -     traMADol (ULTRAM) 50 MG tablet; Take 1 tablet (50 mg total) by mouth every 8 (eight) hours as needed. -     Ambulatory referral to Dermatology -     CBC -     Basic Metabolic Panel -     Sedimentation Rate -     C-reactive protein -     ketoconazole (NIZORAL) 2 % cream; Apply 1 application topically daily. Apply to hands and feet -     triamcinolone ointment (KENALOG) 0.5 %; Apply 1 application topically 2 (two) times daily. Apply to body   You will be contacted with lab results  Please also sign up for mychart  F/u in 2 weeks for skin rash   Dr. Armen PickupFunches

## 2015-07-20 LAB — SEDIMENTATION RATE: SED RATE: 34 mm/h — AB (ref 0–30)

## 2015-07-23 ENCOUNTER — Telehealth: Payer: Self-pay | Admitting: *Deleted

## 2015-07-23 NOTE — Telephone Encounter (Signed)
-----   Message from Dessa PhiJosalyn Funches, MD sent at 07/23/2015  8:49 AM EDT ----- Sed rate slightly elevated Mild anemia  Normal BMP and CRP Continue current care plan  GI referral for colonoscopy needed for screening and to evaluate anemia

## 2015-07-23 NOTE — Telephone Encounter (Signed)
Date of birth verified by pt  Results given  Notified GI Referral placed  Continue current medication  Pt verbalized understanding

## 2015-07-23 NOTE — Addendum Note (Signed)
Addended by: Dessa PhiFUNCHES, Gwendolynn Merkey on: 07/23/2015 08:50 AM   Modules accepted: Orders

## 2015-07-31 ENCOUNTER — Emergency Department (INDEPENDENT_AMBULATORY_CARE_PROVIDER_SITE_OTHER)
Admission: EM | Admit: 2015-07-31 | Discharge: 2015-07-31 | Disposition: A | Discharge status: Home / Self Care | Payer: No Typology Code available for payment source | Source: Home / Self Care | Attending: Family Medicine | Admitting: Family Medicine

## 2015-07-31 ENCOUNTER — Encounter (HOSPITAL_COMMUNITY): Payer: Self-pay | Admitting: Emergency Medicine

## 2015-07-31 DIAGNOSIS — M6528 Calcific tendinitis, other site: Secondary | ICD-10-CM

## 2015-07-31 NOTE — ED Provider Notes (Signed)
CSN: [id number]     Arrival date & time 07/31/15  1258 History   First MD Initiated Contact with Patient 07/31/15 1311     Chief Complaint  Patient presents with  . Foot Pain   (Consider location/radiation/quality/duration/timing/severity/associated sxs/prior Treatment) HPI Comments: [id number] showed female presents with right heel pain. She started having pain in her right posterior heel 15 years ago. It is gotten worse in the past month since she obtained a job in which she has to stand and ambulate. Pain is located to the posterior heel and radiates superiorly along the Achilles tendon. It is worse with standing and activity. In addition she is having cramping type pain in her right calf. She has been having this for a few weeks and she visited the urgent care with his complaint this month and because her d-dimer was slightly elevated she was sent to the urgent care. Ultrasound revealed no DVT.   Past Medical History  Diagnosis Date  . Thyroid disease    Past Surgical History  Procedure Laterality Date  . Abdominal hysterectomy    . Nasal sinus surgery    . Bladder surgery    . Neck surgery      20 years ago   . Foot surgery Bilateral    No family history on file. Social History  Substance Use Topics  . Smoking status: Current Every Day Smoker -- 0.50 packs/day for 7 years  . Smokeless tobacco: None  . Alcohol Use: No   OB History    No data available     Review of Systems  Constitutional: Positive for activity change. Negative for fever.  HENT: Negative.   Respiratory: Negative.   Gastrointestinal: Negative.   Musculoskeletal: Positive for myalgias. Negative for back pain, neck pain and neck stiffness.  Skin: Negative.   Neurological: Negative.     Allergies  Bactrim and Penicillins  Home Medications   Prior to Admission medications   Medication Sig Start Date End Date Taking? Authorizing Provider  Cetirizine HCl 10 MG CAPS Take 1 capsule (10 mg total) by mouth  daily. 11/09/14   Doris Cheadleeepak Advani, MD  diclofenac (VOLTAREN) 75 MG EC tablet Take 1 tablet (75 mg total) by mouth 2 (two) times daily. 06/10/15   Vivi BarrackMatthew R Wagoner, DPM  DULoxetine (CYMBALTA) 30 MG capsule Take 1 capsule (30 mg total) by mouth daily. 06/19/15   Quentin Angstlugbemiga E Jegede, MD  escitalopram (LEXAPRO) 10 MG tablet Take 10 mg by mouth daily.    Historical Provider, MD  fluconazole (DIFLUCAN) 150 MG tablet Take 1 tablet (150 mg total) by mouth once. Take 2nd pill if symptoms not completely resolved in 3 days. 06/27/15   Charm RingsErin J Honig, MD  ketoconazole (NIZORAL) 2 % cream Apply 1 application topically daily. Apply to hands and feet 07/19/15   Dessa PhiJosalyn Funches, MD  levothyroxine (SYNTHROID, LEVOTHROID) 112 MCG tablet Take 1 tablet (112 mcg total) by mouth daily. 07/01/15   Josalyn Funches, MD  mirtazapine (REMERON) 15 MG tablet Take 15 mg by mouth at bedtime.    Historical Provider, MD  QUEtiapine (SEROQUEL) 50 MG tablet Take 50 mg by mouth at bedtime.    Historical Provider, MD  traMADol (ULTRAM) 50 MG tablet Take 1 tablet (50 mg total) by mouth every 8 (eight) hours as needed. 07/19/15   Josalyn Funches, MD  triamcinolone ointment (KENALOG) 0.5 % Apply 1 application topically 2 (two) times daily. Apply to body 07/19/15   Dessa PhiJosalyn Funches, MD   Meds Ordered and  Administered this Visit  Medications - No data to display  There were no vitals taken for this visit. No data found.   Physical Exam  Constitutional: She is oriented to person, place, and time. She appears well-developed and well-nourished. No distress.  Eyes: EOM are normal.  Neck: Normal range of motion. Neck supple.  Cardiovascular: Normal rate.   Pulmonary/Chest: Effort normal. No respiratory distress.  Musculoskeletal: She exhibits no edema.  The right posterior heel along the base of the Achilles tendon has a firm hard nodule surrounding tendon. There is tenderness along the Achilles tendon ascending into the calf. There is minor calf  Tenderness but no swelling. No erythema or other discoloration. Distal neurovascular motor Sentry is intact. Dorsiflexion and plantar flexion is intact but painful particularly with weightbearing and ambulation. No tenderness to the plantar aspect of the foot.  Neurological: She is alert and oriented to person, place, and time. She exhibits normal muscle tone.  Skin: Skin is warm and dry.  Psychiatric: She has a normal mood and affect.  Nursing note and vitals reviewed.   ED Course  Procedures (including critical care time)  Labs Review Labs Reviewed - No data to display  Imaging Review No results found.   Visual Acuity Review  Right Eye Distance:   Left Eye Distance:   Bilateral Distance:    Right Eye Near:   Left Eye Near:    Bilateral Near:         MDM   1. Calcific Achilles tendinitis    Applied a Coban wrap to the ankle to maintain 90 position. Apply heat to the calf and perform stretches to the calf muscle has demonstrated. Follow-up with the podiatrist just as soon as possible. Continue to follow with Dr. Armen Pickup as necessary.    Hayden Rasmussen, NP 07/31/15 1526

## 2015-07-31 NOTE — ED Notes (Addendum)
Patient reports issues with right foot for 15 years.  Patient has recently joined the work force one month ago.  Patient has to stand a lot and pain in foot and is having lower leg muscle spasms bilaterally.  Patient wears "new balance" shoes to work.  Patient reports having a ' heel spurs' and this does not feel the same

## 2015-07-31 NOTE — Discharge Instructions (Signed)
(  Possible Calcific) Tendinitis of the Achilles tendon Calcific tendinitis occurs when crystals of calcium are deposited in a tendon. Tendons are bands of strong, fibrous tissue that attach muscles to bones. Tendons are an important part of joints. They make the joint move and they absorb some of the stress that a joint receives during use. When calcium is deposited in the tendon, the tendon becomes stiff, painful, and it can become swollen. Calcific tendinitis occurs frequently in the shoulder joint, in a structure called the rotator cuff. CAUSES  The cause of calcific tendinitis is unclear. It may be associated with:  Overuse of the tendon, such as from repetitive motion.  Excess stress on the tendon.  Aging.  Repetitive, mild injuries. SYMPTOMS   Pain may or may not be present. If it is present, it may occur when moving the joint.  Tenderness when pressure is applied to the tendon.  A snapping or popping sound when the joint moves.  Decreased motion of the joint.  Difficulty sleeping due to pain in the joint. DIAGNOSIS  Your health care provider will perform a physical exam. Imaging tests may also be used to make the diagnosis. These may include X-rays, an MRI, or a CT scan. TREATMENT  Generally, calcific tendinitis resolves on its own. Treatment for pain of calcific tendinitis may include:  Taking over-the-counter medicines, such as anti-inflammatory drugs.  Applying ice packs to the joint.  Following a specific exercise program to keep the joint working properly.  Attending physical therapy sessions.  Avoiding activities that cause pain. Treatment for more severe calcific tendinitis may require:  Injecting cortisone steroids or pain relieving medicines into or around the joint.  Manipulating the joint after you are given medicine to numb the area (local anesthetic).  Inflating the joint with sterile fluid to increase the flexibility of the tendons.  Shock wave  therapy, which involves focusing sound waves on the joint. If other treatments do not work, surgery may be done to clean out the calcium deposits and repair the tendons where needed. Most people do not need surgery. HOME CARE INSTRUCTIONS   Only take over-the-counter or prescription medicines for pain, fever, or discomfort as directed by your health care provider.  Follow your health care provider's recommendations for activity and exercise. SEEK MEDICAL CARE IF:  You notice an increase in pain or numbness.  You develop new weakness.  You notice increased joint stiffness or a sensation of looseness in the joint.  You notice increasing redness, swelling, or warmth around the joint area. SEEK IMMEDIATE MEDICAL CARE IF:  You have a fever or persistent symptoms for more than 2 to 3 days.  You have a fever and your symptoms suddenly get worse. MAKE SURE YOU:  Understand these instructions.  Will watch your condition.  Will get help right away if you are not doing well or get worse.   This information is not intended to replace advice given to you by your health care provider. Make sure you discuss any questions you have with your health care provider.   Document Released: 01/28/2008 Document Revised: 01/09/2015 Document Reviewed: 07/30/2011 Elsevier Interactive Patient Education [employer] Elsevier Inc.

## 2015-08-05 ENCOUNTER — Ambulatory Visit: Payer: No Typology Code available for payment source | Admitting: Family Medicine

## 2015-08-06 MED FILL — LEVOTHYROXINE 112 MCG TAB: 112 | 30 days supply | Qty: 30 | Fill #1

## 2015-08-09 ENCOUNTER — Other Ambulatory Visit: Payer: Self-pay | Admitting: Internal Medicine

## 2015-08-09 MED FILL — ?CETIRIZINE HCL 10 MG TABLE: 10 | 30 days supply | Qty: 30 | Fill #0

## 2015-08-09 MED FILL — DULoxetine HCL 30 MG CPEP: 30 | 30 days supply | Qty: 30 | Fill #8

## 2015-08-12 ENCOUNTER — Telehealth: Payer: Self-pay | Admitting: *Deleted

## 2015-08-12 NOTE — Telephone Encounter (Signed)
Faxed request for Diclofenac.  Denied and return faxed.

## 2015-08-16 ENCOUNTER — Encounter (HOSPITAL_COMMUNITY): Payer: Self-pay | Admitting: Emergency Medicine

## 2015-08-16 ENCOUNTER — Ambulatory Visit (HOSPITAL_COMMUNITY)
Admission: EM | Admit: 2015-08-16 | Discharge: 2015-08-16 | Disposition: A | Discharge status: Home / Self Care | Payer: No Typology Code available for payment source | Attending: Family Medicine | Admitting: Family Medicine

## 2015-08-16 DIAGNOSIS — M545 Low back pain, unspecified: Secondary | ICD-10-CM

## 2015-08-16 DIAGNOSIS — M79604 Pain in right leg: Secondary | ICD-10-CM

## 2015-08-16 DIAGNOSIS — T148XXA Other injury of unspecified body region, initial encounter: Secondary | ICD-10-CM

## 2015-08-16 DIAGNOSIS — T148 Other injury of unspecified body region: Secondary | ICD-10-CM

## 2015-08-16 MED ORDER — TRAMADOL HCL 50 MG PO TABS: 50.0000 mg | ORAL_TABLET | Freq: Four times a day (QID) | ORAL | Status: DC | PRN

## 2015-08-16 MED ORDER — DICLOFENAC POTASSIUM 50 MG PO TABS: 50.0000 mg | ORAL_TABLET | Freq: Three times a day (TID) | ORAL | Status: DC

## 2015-08-16 MED ORDER — IBUPROFEN 800 MG PO TABS: 800.0000 mg | ORAL_TABLET | Freq: Three times a day (TID) | ORAL | Status: AC

## 2015-08-16 NOTE — ED Notes (Signed)
Right lower back pain with pain down right leg to the heel.  Issues for 2 months.  No specific injury.  Reports foot pain is terrible, pain with weight bearing

## 2015-08-16 NOTE — ED Provider Notes (Signed)
CSN: [id number]     Arrival date & time 08/16/15  1720 History   First MD Initiated Contact with Patient 08/16/15 1934     Chief Complaint  Patient presents with  . Back Pain   (Consider location/radiation/quality/duration/timing/severity/associated sxs/prior Treatment) HPI Comments: 55 year old female is complaining of pain in the right low back as well as the right buttock, right posterior thigh and calf. She states that should she went back to work 3 days ago having to reform physical labor with bending and walking this has produced and exacerbated this pain. She has visited the urgent care several times recently with apparently work related pain in the foot, heel and other areas. She states every time she tries to work she has pain primarily in the back or extremities. Denies any single event that may have caused injury. She states it is due to the repetitive and ongoing bending and walking. Denies focal paresthesias or weakness.  She is also complaining of persistent heel pain due to calcified Achilles tendinitis.   Past Medical History  Diagnosis Date  . Thyroid disease    Past Surgical History  Procedure Laterality Date  . Abdominal hysterectomy    . Nasal sinus surgery    . Bladder surgery    . Neck surgery      20 years ago   . Foot surgery Bilateral    No family history on file. Social History  Substance Use Topics  . Smoking status: Current Every Day Smoker -- 0.50 packs/day for 7 years  . Smokeless tobacco: None  . Alcohol Use: No   OB History    No data available     Review of Systems  Constitutional: Positive for activity change. Negative for fever and appetite change.  HENT: Negative.   Respiratory: Negative.   Musculoskeletal: Positive for myalgias, back pain and gait problem. Negative for neck pain and neck stiffness.  Neurological: Negative.   Psychiatric/Behavioral: Negative.     Allergies  Bactrim and Penicillins  Home Medications   Prior to  Admission medications   Medication Sig Start Date End Date Taking? Authorizing Provider  DULoxetine (CYMBALTA) 30 MG capsule Take 1 capsule (30 mg total) by mouth daily. 06/19/15  Yes Quentin Angstlugbemiga E Jegede, MD  levothyroxine (SYNTHROID, LEVOTHROID) 112 MCG tablet Take 1 tablet (112 mcg total) by mouth daily. 07/01/15  Yes Josalyn Funches, MD  mirtazapine (REMERON) 15 MG tablet Take 15 mg by mouth at bedtime.   Yes Historical Provider, MD  QUEtiapine (SEROQUEL) 50 MG tablet Take 50 mg by mouth at bedtime.   Yes Historical Provider, MD  cetirizine (ZYRTEC) 10 MG tablet TAKE 1 TABLET BY MOUTH DAILY 08/09/15   Dessa PhiJosalyn Funches, MD  diclofenac (CATAFLAM) 50 MG tablet Take 1 tablet (50 mg total) by mouth 3 (three) times daily. One tablet TID with food prn pain. 08/16/15   Hayden Rasmussenavid Tajae Maiolo, NP  escitalopram (LEXAPRO) 10 MG tablet Take 10 mg by mouth daily.    Historical Provider, MD  fluconazole (DIFLUCAN) 150 MG tablet Take 1 tablet (150 mg total) by mouth once. Take 2nd pill if symptoms not completely resolved in 3 days. 06/27/15   Charm RingsErin J Honig, MD  ketoconazole (NIZORAL) 2 % cream Apply 1 application topically daily. Apply to hands and feet 07/19/15   Dessa PhiJosalyn Funches, MD  traMADol (ULTRAM) 50 MG tablet Take 1 tablet (50 mg total) by mouth every 6 (six) hours as needed. 08/16/15   Hayden Rasmussenavid Annagrace Carr, NP  triamcinolone ointment (KENALOG) 0.5 % Apply  1 application topically 2 (two) times daily. Apply to body 07/19/15   Dessa Phi, MD   Meds Ordered and Administered this Visit  Medications - No data to display  BP 139/85 mmHg  Pulse 69  Temp(Src) 97.5 F (36.4 C) (Oral)  SpO2 100% No data found.   Physical Exam  Constitutional: She appears well-developed and well-nourished. No distress.  Eyes: EOM are normal.  Neck: Normal range of motion. Neck supple.  Cardiovascular: Normal rate.   Pulmonary/Chest: Effort normal. No respiratory distress.  Musculoskeletal: She exhibits no edema.  Tenderness to the right para  lumbosacral musculature. Tenderness to the musculature of the posterior buttock, posterior thigh and calf. No swelling. No discoloration. Pain with stretching of the musculature involved. No focal weakness. Patient is ambulatory. There is tenderness to the right posterior heel has before. No spinal tenderness.  Neurological: She is alert. She exhibits normal muscle tone.  Skin: Skin is warm and dry.  Psychiatric: She has a normal mood and affect.  Nursing note and vitals reviewed.   ED Course  Procedures (including critical care time)  Labs Review Labs Reviewed - No data to display  Imaging Review No results found.   Visual Acuity Review  Right Eye Distance:   Left Eye Distance:   Bilateral Distance:    Right Eye Near:   Left Eye Near:    Bilateral Near:         MDM   1. Right-sided low back pain without sciatica   2. Muscle strain   3. Pain of right lower extremity    Heat, stretches. No heavy lifting or pulling Meds ordered this encounter  Medications  . traMADol (ULTRAM) 50 MG tablet    Sig: Take 1 tablet (50 mg total) by mouth every 6 (six) hours as needed.    Dispense:  15 tablet    Refill:  0    Order Specific Question:  Supervising Provider    Answer:  Charm Rings [id number]  . diclofenac (CATAFLAM) 50 MG tablet    Sig: Take 1 tablet (50 mg total) by mouth 3 (three) times daily. One tablet TID with food prn pain.    Dispense:  21 tablet    Refill:  0    Order Specific Question:  Supervising Provider    Answer:  Charm Rings [id number]   About medicines were discontinued after the patient requested. She states that they do not work. She is advised that I am unable to give her anything stronger since she plans to go back to work. She is requesting ibuprofen 800 mg for pain.    Hayden Rasmussen, NP 08/16/15 2009  Hayden Rasmussen, NP 08/16/15 2026

## 2015-08-16 NOTE — ED Notes (Signed)
Patient reports tramadol will not help and requesting ibuprofen.

## 2015-08-16 NOTE — Discharge Instructions (Signed)
° ° °Back Pain, Adult °Back pain is very common in adults. The cause of back pain is rarely dangerous and the pain often gets better over time. The cause of your back pain may not be known. Some common causes of back pain include: °· Strain of the muscles or ligaments supporting the spine. °· Wear and tear (degeneration) of the spinal disks. °· Arthritis. °· Direct injury to the back. °For many people, back pain may return. Since back pain is rarely dangerous, most people can learn to manage this condition on their own. °HOME CARE INSTRUCTIONS °Watch your back pain for any changes. The following actions may help to lessen any discomfort you are feeling: °· Remain active. It is stressful on your back to sit or stand in one place for long periods of time. Do not sit, drive, or stand in one place for more than 30 minutes at a time. Take short walks on even surfaces as soon as you are able. Try to increase the length of time you walk each day. °· Exercise regularly as directed by your health care provider. Exercise helps your back heal faster. It also helps avoid future injury by keeping your muscles strong and flexible. °· Do not stay in bed. Resting more than 1-2 days can delay your recovery. °· Pay attention to your body when you bend and lift. The most comfortable positions are those that put less stress on your recovering back. Always use proper lifting techniques, including: °¨ Bending your knees. °¨ Keeping the load close to your body. °¨ Avoiding twisting. °· Find a comfortable position to sleep. Use a firm mattress and lie on your side with your knees slightly bent. If you lie on your back, put a pillow under your knees. °· Avoid feeling anxious or stressed. Stress increases muscle tension and can worsen back pain. It is important to recognize when you are anxious or stressed and learn ways to manage it, such as with exercise. °· Take medicines only as directed by your health care provider. Over-the-counter  medicines to reduce pain and inflammation are often the most helpful. Your health care provider may prescribe muscle relaxant drugs. These medicines help dull your pain so you can more quickly return to your normal activities and healthy exercise. °· Apply ice to the injured area: °¨ Put ice in a plastic bag. °¨ Place a towel between your skin and the bag. °¨ Leave the ice on for 20 minutes, 2-3 times a day for the first 2-3 days. After that, ice and heat may be alternated to reduce pain and spasms. °· Maintain a healthy weight. Excess weight puts extra stress on your back and makes it difficult to maintain good posture. °SEEK MEDICAL CARE IF: °· You have pain that is not relieved with rest or medicine. °· You have increasing pain going down into the legs or buttocks. °· You have pain that does not improve in one week. °· You have night pain. °· You lose weight. °· You have a fever or chills. °SEEK IMMEDIATE MEDICAL CARE IF:  °· You develop new bowel or bladder control problems. °· You have unusual weakness or numbness in your arms or legs. °· You develop nausea or vomiting. °· You develop abdominal pain. °· You feel faint. °  °This information is not intended to replace advice given to you by your health care provider. Make sure you discuss any questions you have with your health care provider. °  °Document Released: 04/20/2005 Document Revised: 05/11/2014 Document Reviewed: 08/22/2013 °Elsevier Interactive Patient Education ©  2016 Elsevier Inc. °Heat Therapy °Heat therapy can help ease sore, stiff, injured, and tight muscles and joints. Heat relaxes your muscles, which may help ease your pain. Heat therapy should only be used on old, pre-existing, or long-lasting (chronic) injuries. Do not use heat therapy unless told by your doctor. °HOW TO USE HEAT THERAPY °There are several different kinds of heat therapy, including: °· Moist heat pack. °· Warm water bath. °· Hot water bottle. °· Electric heating pad. °· Heated  gel pack. °· Heated wrap. °· Electric heating pad. °GENERAL HEAT THERAPY RECOMMENDATIONS  °· Do not sleep while using heat therapy. Only use heat therapy while you are awake. °· Your skin may turn pink while using heat therapy. Do not use heat therapy if your skin turns red. °· Do not use heat therapy if you have new pain. °· High heat or long exposure to heat can cause burns. Be careful when using heat therapy to avoid burning your skin. °· Do not use heat therapy on areas of your skin that are already irritated, such as with a rash or sunburn. °GET HELP IF:  °· You have blisters, redness, swelling (puffiness), or numbness. °· You have new pain. °· Your pain is worse. °MAKE SURE YOU: °· Understand these instructions. °· Will watch your condition. °· Will get help right away if you are not doing well or get worse. °  °This information is not intended to replace advice given to you by your health care provider. Make sure you discuss any questions you have with your health care provider. °  °Document Released: 07/13/2011 Document Revised: 05/11/2014 Document Reviewed: 06/13/2013 °Elsevier Interactive Patient Education ©2016 Elsevier Inc. ° °

## 2015-08-20 ENCOUNTER — Ambulatory Visit: Payer: No Typology Code available for payment source | Attending: Family Medicine | Admitting: Family Medicine

## 2015-08-20 ENCOUNTER — Encounter: Payer: Self-pay | Admitting: Family Medicine

## 2015-08-20 VITALS — BP 114/75 | HR 83 | Temp 98.4°F | Resp 16 | Ht 63.0 in | Wt 215.0 lb

## 2015-08-20 DIAGNOSIS — G8929 Other chronic pain: Secondary | ICD-10-CM | POA: Insufficient documentation

## 2015-08-20 DIAGNOSIS — E039 Hypothyroidism, unspecified: Secondary | ICD-10-CM

## 2015-08-20 DIAGNOSIS — R21 Rash and other nonspecific skin eruption: Secondary | ICD-10-CM

## 2015-08-20 DIAGNOSIS — G5790 Unspecified mononeuropathy of unspecified lower limb: Secondary | ICD-10-CM

## 2015-08-20 DIAGNOSIS — F17210 Nicotine dependence, cigarettes, uncomplicated: Secondary | ICD-10-CM | POA: Insufficient documentation

## 2015-08-20 DIAGNOSIS — G629 Polyneuropathy, unspecified: Secondary | ICD-10-CM | POA: Insufficient documentation

## 2015-08-20 DIAGNOSIS — H547 Unspecified visual loss: Secondary | ICD-10-CM

## 2015-08-20 DIAGNOSIS — Z79899 Other long term (current) drug therapy: Secondary | ICD-10-CM | POA: Insufficient documentation

## 2015-08-20 DIAGNOSIS — M7989 Other specified soft tissue disorders: Secondary | ICD-10-CM

## 2015-08-20 DIAGNOSIS — M79671 Pain in right foot: Secondary | ICD-10-CM

## 2015-08-20 DIAGNOSIS — M545 Low back pain: Secondary | ICD-10-CM | POA: Insufficient documentation

## 2015-08-20 LAB — BASIC METABOLIC PANEL
BUN: 12 mg/dL (ref 7–25)
CALCIUM: 9.1 mg/dL (ref 8.6–10.4)
CO2: 20 mmol/L (ref 20–31)
CREATININE: 0.93 mg/dL (ref 0.50–1.05)
Chloride: 110 mmol/L (ref 98–110)
GLUCOSE: 94 mg/dL (ref 65–99)
Potassium: 3.9 mmol/L (ref 3.5–5.3)
Sodium: 141 mmol/L (ref 135–146)

## 2015-08-20 LAB — TSH: TSH: 0.22 mIU/L — ABNORMAL LOW

## 2015-08-20 MED ORDER — METHYLPREDNISOLONE ACETATE 40 MG/ML INJ SUSP (RADIOLOG: 120.0000 mg | Freq: Once | INTRAMUSCULAR | Status: DC

## 2015-08-20 MED ORDER — METHYLPREDNISOLONE ACETATE 40 MG/ML IJ SUSP
40.0000 mg | Freq: Once | INTRAMUSCULAR | Status: AC
  Administered 2015-08-20: 40 mg via INTRAMUSCULAR

## 2015-08-20 MED ORDER — KETOCONAZOLE 2 % EX CREA: 1.0000 "application " | TOPICAL_CREAM | Freq: Every day | CUTANEOUS | Status: AC

## 2015-08-20 MED ORDER — METHYLPREDNISOLONE ACETATE 40 MG/ML IJ SUSP: 20.0000 mg | Freq: Once | INTRAMUSCULAR | Status: DC

## 2015-08-20 MED ORDER — GABAPENTIN 300 MG PO CAPS: 300.0000 mg | ORAL_CAPSULE | Freq: Two times a day (BID) | ORAL | Status: AC

## 2015-08-20 MED FILL — IBUPROFEN 800 MG TABLET: 800 | 7 days supply | Qty: 21 | Fill #0

## 2015-08-20 MED FILL — KETOCONAZOLE 2% CREAM: 2 | 20 days supply | Qty: 60 | Fill #0

## 2015-08-20 MED FILL — GABAPENTIN 300 MG CAPSULE: 300 | 30 days supply | Qty: 60 | Fill #0

## 2015-08-20 NOTE — Assessment & Plan Note (Signed)
A: R heel pain consistent with Achilles tendonitis P: NSAID Podiatry referral Ice

## 2015-08-20 NOTE — Assessment & Plan Note (Signed)
Neuropathy in both feet  Add gabapentin Neurology referral

## 2015-08-20 NOTE — Assessment & Plan Note (Signed)
Improved Stop topical steroid Continue topical antifungal on hands, feet and abdomen  Keep skin cool and dry

## 2015-08-20 NOTE — Progress Notes (Signed)
F/U infection on feet, rash on abdomen  No changes since last visit  Pain scale #10 Tobacco user 1/2 ppday  No suicidal thoughts in the past two weeks

## 2015-08-20 NOTE — Patient Instructions (Addendum)
[name] was seen today for rash.  Diagnoses and all orders for this visit:  Hypothyroidism, unspecified hypothyroidism type -     TSH  Leg swelling -     Basic Metabolic Panel  Pain of right heel -     Discontinue: methylPREDNISolone acetate (DEPO-MEDROL) injection (RADIOLOGY ONLY) 120 mg; Inject 3 mLs (120 mg total) into the muscle once. -     Ambulatory referral to Podiatry -     methylPREDNISolone acetate (DEPO-MEDROL) injection 20 mg; Inject 0.5 mLs (20 mg total) into the muscle once.  Poor vision -     Ambulatory referral to Ophthalmology  Neuropathy of foot, unspecified laterality -     gabapentin (NEURONTIN) 300 MG capsule; Take 1 capsule (300 mg total) by mouth 2 (two) times daily. -     Ambulatory referral to Neurology  Skin rash -     ketoconazole (NIZORAL) 2 % cream; Apply 1 application topically daily.   Start gabapentin 300 mg nightly for one week, then 300 mg twice daily for foot pains. Use nizoral cream on hands, feet and abdominal rash. Keep skin cool and dry.   F/u in 8 weeks for foot pains   Dr. Armen PickupFunches

## 2015-08-20 NOTE — Progress Notes (Signed)
Patient ID: Annette Boyer, female   DOB: [birth year], 55 y.o.   MRN: [id number]   Subjective:  Patient ID: Annette Palaueresa Crammer, female    DOB: [birth year]  Age: 55 y.o. MRN: [id number]  CC: Rash   HPI Annette Palaueresa Vanderweele has chronic anxiety and depression,   1.  Rash: x  7weeks. Started on feet, then hands. Then progressed to involve arms, trunk and back. Improved on hands, feet and back with topical antifungal and topical steroid. Still rash on abdomen under skin folds. Admits to getting sweaty and warm.    2. Foot pain: x 7 months. B/l heel pain. Worse on R heel with posterior heel swelling. Pain is exacerbated by standing from sitting or laying down. Pain is achy, sharp, burning. Also exacerbated by standing for a prolonged period. She wears [employer] tennis shoes. She has tramadol for pain without relief. NSAIDs helped.  Heat helps.  She has hx of chronic low back pain and had some back pain last week that has improved.   Social History  Substance Use Topics  . Smoking status: Current Every Day Smoker -- 0.50 packs/day for 7 years  . Smokeless tobacco: Not on file  . Alcohol Use: No    Outpatient Prescriptions Prior to Visit  Medication Sig Dispense Refill  . cetirizine (ZYRTEC) 10 MG tablet TAKE 1 TABLET BY MOUTH DAILY 30 tablet 2  . DULoxetine (CYMBALTA) 30 MG capsule Take 1 capsule (30 mg total) by mouth daily. 120 capsule 3  . ibuprofen (ADVIL,MOTRIN) 800 MG tablet Take 1 tablet (800 mg total) by mouth 3 (three) times daily. 21 tablet 0  . ketoconazole (NIZORAL) 2 % cream Apply 1 application topically daily. Apply to hands and feet 60 g 0  . levothyroxine (SYNTHROID, LEVOTHROID) 112 MCG tablet Take 1 tablet (112 mcg total) by mouth daily. 30 tablet 5  . mirtazapine (REMERON) 15 MG tablet Take 15 mg by mouth at bedtime.    Marland Kitchen. QUEtiapine (SEROQUEL) 50 MG tablet Take 50 mg by mouth at bedtime.    . triamcinolone ointment (KENALOG) 0.5 % Apply 1 application topically 2 (two) times daily.  Apply to body 30 g 0  . escitalopram (LEXAPRO) 10 MG tablet Take 10 mg by mouth daily. Reported on 08/20/2015    . fluconazole (DIFLUCAN) 150 MG tablet Take 1 tablet (150 mg total) by mouth once. Take 2nd pill if symptoms not completely resolved in 3 days. 2 tablet 0   No facility-administered medications prior to visit.    ROS Review of Systems  Constitutional: Negative for fever and chills.  Eyes: Positive for visual disturbance.  Respiratory: Positive for cough. Negative for shortness of breath.   Cardiovascular: Negative for chest pain.  Gastrointestinal: Negative for abdominal pain and blood in stool.  Musculoskeletal: Positive for myalgias, arthralgias and gait problem. Negative for back pain.  Skin: Positive for rash.  Allergic/Immunologic: Negative for immunocompromised state.  Hematological: Negative for adenopathy. Does not bruise/bleed easily.  Psychiatric/Behavioral: Positive for sleep disturbance and dysphoric mood. Negative for suicidal ideas. The patient is nervous/anxious.     Objective:  BP 114/75 mmHg  Pulse 83  Temp(Src) 98.4 F (36.9 C) (Oral)  Resp 16  Ht 5\' 3"  (1.6 m)  Wt 215 lb (97.523 kg)  BMI 38.09 kg/m2  SpO2 100%  BP/Weight 08/20/2015 08/16/2015 07/31/2015  Systolic BP 114 139 131  Diastolic BP 75 85 88  Wt. (Lbs) 215 - -  BMI 38.09 - -   Wt Readings from Last  3 Encounters:  08/20/15 215 lb (97.523 kg)  07/19/15 216 lb 9.6 oz (98.249 kg)  04/24/15 217 lb (98.431 kg)    Physical Exam  Constitutional: She is oriented to person, place, and time. She appears well-developed and well-nourished. No distress.  HENT:  Head: Normocephalic and atraumatic.  Cardiovascular: Normal rate, regular rhythm, normal heart sounds and intact distal pulses.   Pulmonary/Chest: Effort normal and breath sounds normal.  Musculoskeletal: She exhibits no edema.       Feet:  Neurological: She is alert and oriented to person, place, and time.  Skin: Skin is warm and dry.  No rash noted.     Psychiatric: She has a normal mood and affect.   Depo medrol 40 mg IM x one today   Lab Results  Component Value Date   HGBA1C 5.5 07/19/2015    Assessment & Plan:   Veena was seen today for rash.  Diagnoses and all orders for this visit:  Hypothyroidism, unspecified hypothyroidism type -     TSH  Leg swelling -     Basic Metabolic Panel  Pain of right heel -     Discontinue: methylPREDNISolone acetate (DEPO-MEDROL) injection (RADIOLOGY ONLY) 120 mg; Inject 3 mLs (120 mg total) into the muscle once. -     Ambulatory referral to Podiatry -     Discontinue: methylPREDNISolone acetate (DEPO-MEDROL) injection 20 mg; Inject 0.5 mLs (20 mg total) into the muscle once. -     methylPREDNISolone acetate (DEPO-MEDROL) injection 40 mg; Inject 1 mL (40 mg total) into the muscle once.  Poor vision -     Ambulatory referral to Ophthalmology  Neuropathy of foot, unspecified laterality -     gabapentin (NEURONTIN) 300 MG capsule; Take 1 capsule (300 mg total) by mouth 2 (two) times daily. -     Ambulatory referral to Neurology  Skin rash -     ketoconazole (NIZORAL) 2 % cream; Apply 1 application topically daily.     No orders of the defined types were placed in this encounter.    Follow-up: No Follow-up on file.   Dessa Phi MD

## 2015-08-22 ENCOUNTER — Telehealth: Payer: Self-pay | Admitting: *Deleted

## 2015-08-22 ENCOUNTER — Telehealth: Payer: Self-pay | Admitting: Family Medicine

## 2015-08-22 DIAGNOSIS — E039 Hypothyroidism, unspecified: Secondary | ICD-10-CM

## 2015-08-22 NOTE — Telephone Encounter (Signed)
LVM to return call.

## 2015-08-22 NOTE — Telephone Encounter (Signed)
-----   Message from Dessa PhiJosalyn Funches, MD sent at 08/21/2015  5:26 PM EDT ----- Normal electrolytes TSH is a bit low suggesting synthroid dose is a bit high, I recommend a dose decrease to 100 mcg daily. If patient agrees,  I will send in dose change

## 2015-08-22 NOTE — Telephone Encounter (Signed)
Pt returning your call

## 2015-08-23 ENCOUNTER — Encounter: Payer: Self-pay | Admitting: Clinical

## 2015-08-23 MED ORDER — LEVOTHYROXINE SODIUM 100 MCG PO TABS: 100.0000 ug | ORAL_TABLET | Freq: Every day | ORAL | Status: AC

## 2015-08-23 MED FILL — ?LEVOTHYROXINE 100 MCG TAB: 100 | 30 days supply | Qty: 30 | Fill #0

## 2015-08-23 NOTE — Telephone Encounter (Signed)
Date of birth verified by pt Lab results given to pt  Pt stated will decrease synthroid dose to   Rx send to [facility]  Pt verbalized understanding

## 2015-08-23 NOTE — Telephone Encounter (Signed)
Return Pt call See preview note

## 2015-08-23 NOTE — Progress Notes (Signed)
[facility] 08/20/2015 07/19/2015 04/24/2015  Decreased Interest 2 3 1   Down, Depressed, Hopeless 2 3 2   PHQ - 2 Score 4 6 3   Altered sleeping 2 3 3   Tired, decreased energy 2 3 3   Change in appetite 2 3 1   Feeling bad or failure about yourself  3 3 3   Trouble concentrating 3 3 3   Moving slowly or fidgety/restless 2 1 2   Suicidal thoughts 0 0 0  PHQ-9 Score 18 22 18     GAD 7 : Generalized Anxiety Score 08/20/2015 07/19/2015 04/24/2015  Nervous, Anxious, on Edge 3 3 3   Control/stop worrying 3 3 3   Worry too much - different things 3 3 3   Trouble relaxing 3 3 3   Restless 2 3 3   Easily annoyed or irritable 1 3 2   Afraid - awful might happen 3 3 1   Total GAD 7 Score 18 21 18

## 2015-08-30 ENCOUNTER — Ambulatory Visit: Payer: Self-pay | Admitting: Podiatry

## 2015-09-05 MED FILL — TERBINAFINE HCL 250 MG TAB: 250 | 30 days supply | Qty: 30 | Fill #0

## 2015-09-16 ENCOUNTER — Other Ambulatory Visit (INDEPENDENT_AMBULATORY_CARE_PROVIDER_SITE_OTHER): Payer: No Typology Code available for payment source

## 2015-09-16 ENCOUNTER — Encounter: Payer: Self-pay | Admitting: Podiatry

## 2015-09-16 ENCOUNTER — Ambulatory Visit (INDEPENDENT_AMBULATORY_CARE_PROVIDER_SITE_OTHER): Payer: No Typology Code available for payment source | Admitting: Podiatry

## 2015-09-16 ENCOUNTER — Other Ambulatory Visit: Payer: Self-pay | Admitting: Internal Medicine

## 2015-09-16 ENCOUNTER — Ambulatory Visit (INDEPENDENT_AMBULATORY_CARE_PROVIDER_SITE_OTHER): Payer: No Typology Code available for payment source | Admitting: Neurology

## 2015-09-16 ENCOUNTER — Encounter: Payer: Self-pay | Admitting: Neurology

## 2015-09-16 VITALS — BP 140/90 | HR 85 | Resp 16 | Ht 63.5 in | Wt 213.0 lb

## 2015-09-16 VITALS — BP 120/78 | HR 78 | Ht 63.0 in | Wt 213.1 lb

## 2015-09-16 DIAGNOSIS — M791 Myalgia, unspecified site: Secondary | ICD-10-CM

## 2015-09-16 DIAGNOSIS — R252 Cramp and spasm: Secondary | ICD-10-CM

## 2015-09-16 DIAGNOSIS — R202 Paresthesia of skin: Secondary | ICD-10-CM

## 2015-09-16 DIAGNOSIS — M7661 Achilles tendinitis, right leg: Secondary | ICD-10-CM

## 2015-09-16 LAB — VITAMIN B12: VITAMIN B 12: 109 pg/mL — AB (ref 211–911)

## 2015-09-16 LAB — CK: CK TOTAL: 118 U/L (ref 7–177)

## 2015-09-16 MED ORDER — TIZANIDINE HCL 2 MG PO CAPS: 2.0000 mg | ORAL_CAPSULE | Freq: Every evening | ORAL | Status: AC | PRN

## 2015-09-16 MED FILL — ?CETIRIZINE HCL 10 MG TABLE: 10 | 30 days supply | Qty: 30 | Fill #1

## 2015-09-16 MED FILL — GABAPENTIN 300 MG CAPSULE: 300 | 30 days supply | Qty: 60 | Fill #1

## 2015-09-16 MED FILL — KETOCONAZOLE 2% CREAM: 2 | 20 days supply | Qty: 60 | Fill #1

## 2015-09-16 MED FILL — tiZANidine HCL 2 MG TABS: 2 | 30 days supply | Qty: 30 | Fill #0

## 2015-09-16 NOTE — Patient Instructions (Addendum)
1.  Check blood work 2.  NCS/EMG of the lower extremities 3.  Start tizanidine 2mg  at bedtime for muscle cramps.   4.  Encouraged to stay well hydrated and start doing posterior leg stretches  Return to clinic in 3 months

## 2015-09-16 NOTE — Progress Notes (Signed)
[facility] - Initial Visit   Date: 09/16/2015  Annette Boyer MRN: [id number] DOB: 03/14/1961   Dear Dr. Armen PickupFunches:  Thank you for your kind referral of Annette Boyer for consultation of bilateral feet paresthesias. Although her history is well known to you, please allow us to reiterate it for the purpose of our medical record. The patient was accompanied to the clinic by self.    History of Present Illness: Annette Boyer is a 55 y.o. right-handed Caucasian female with GERD, hypothyroidism, prior tobacco use presenting for evaluation of bilateral feet paresthesias.  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  She started working at [employer] in February 2017 and within a week, she began having severe pain of the legs, described as dull, achy, throbbing, pounding, sharp, shooting.  Pain involves her entire legs, from her thighs down.  If she rests for a few days, then her pain is better.  She has numbness and tingling of the feet and was started on gabapentin 600mg  BID without any relief.  She denies any weakness or imbalance.  She has not been able to work a full time schedule because of her pain. She also has severe bilateral leg cramps, which can wake her up from sleeping.    She also complains of right heel pain and swelling and is scheduled to see podiatry later today.     Out-side paper records, electronic medical record, and images have been reviewed where available and summarized as:  Lab Results  Component Value Date   TSH 0.22* 08/20/2015   Lab Results  Component Value Date   HGBA1C 5.5 07/19/2015    Past Medical History  Diagnosis Date  . Thyroid disease   . GERD (gastroesophageal reflux disease)   . History of tobacco use     Past Surgical History  Procedure Laterality Date  . Abdominal hysterectomy    . Nasal sinus surgery    . Bladder  surgery    . Neck surgery      20 years ago   . Foot surgery Bilateral      Medications:  Outpatient Encounter Prescriptions as of 09/16/2015  Medication Sig Note  . cetirizine (ZYRTEC) 10 MG tablet TAKE 1 TABLET BY MOUTH DAILY   . DULoxetine (CYMBALTA) 30 MG capsule Take 1 capsule (30 mg total) by mouth daily.   Marland Kitchen. gabapentin (NEURONTIN) 300 MG capsule Take 1 capsule (300 mg total) by mouth 2 (two) times daily.   Marland Kitchen. ibuprofen (ADVIL,MOTRIN) 800 MG tablet Take 1 tablet (800 mg total) by mouth 3 (three) times daily.   Marland Kitchen. ketoconazole (NIZORAL) 2 % cream Apply 1 application topically daily.   Marland Kitchen. levothyroxine (SYNTHROID, LEVOTHROID) 100 MCG tablet Take 1 tablet (100 mcg total) by mouth daily.   . mirtazapine (REMERON) 15 MG tablet Take 15 mg by mouth at bedtime.   Marland Kitchen. QUEtiapine (SEROQUEL) 50 MG tablet Take 50 mg by mouth at bedtime.   . terbinafine (LAMISIL) 250 MG tablet  09/16/2015: Received from: External Pharmacy  . tizanidine (ZANAFLEX) 2 MG capsule Take 1 capsule (2 mg total) by mouth at bedtime and may repeat dose one time if needed.    No facility-administered encounter medications on file as of 09/16/2015.     Allergies:  Allergies  Allergen Reactions  . Bactrim [Sulfamethoxazole-Trimethoprim] Hives  . Penicillins Hives    Has patient had a PCN  reaction causing immediate rash, facial/tongue/throat swelling, SOB or lightheadedness with hypotension: Yes Has patient had a PCN reaction causing severe rash involving mucus membranes or skin necrosis: No Has patient had a PCN reaction that required hospitalization No Has patient had a PCN reaction occurring within the last 10 years: No If all of the above answers are "NO", then may proceed with Cephalosporin use.     Family History: Family History  Problem Relation Age of Onset  . Diabetes Mother   . Hypertension Mother   . Cancer Paternal Aunt   . COPD Mother     Deceased  . Healthy Brother   . Healthy Sister     Social  History: Social History  Substance Use Topics  . Smoking status: Former Smoker -- 1.00 packs/day for 9 years    Types: Cigarettes    Quit date: 09/13/2015  . Smokeless tobacco: Never Used  . Alcohol Use: No     Comment: Reports pint of liquor x 9 months.  She does not drink any more.    Social History   Social History Narrative   Lives in a ministry.  Has 2 children.     Works at [employer] on [employer] [address].     Education: 10th grade.    Review of Systems:  CONSTITUTIONAL: No fevers, chills, night sweats, or weight loss.   EYES: No visual changes or eye pain ENT: No hearing changes.  No history of nose bleeds.   RESPIRATORY: No cough, wheezing and shortness of breath.   CARDIOVASCULAR: Negative for chest pain, and palpitations.   GI: Negative for abdominal discomfort, blood in stools or black stools.  No recent change in bowel habits.   GU:  No history of incontinence.   MUSCLOSKELETAL: +history of joint pain or swelling.  +myalgias.   SKIN: Negative for lesions, rash, and itching.   HEMATOLOGY/ONCOLOGY: Negative for prolonged bleeding, bruising easily, and swollen nodes.  No history of cancer.   ENDOCRINE: Negative for cold or heat intolerance, polydipsia or goiter.   PSYCH:  +depression or anxiety symptoms.   NEURO: As Above.   Vital Signs:  BP 120/78 mmHg  Pulse 78  Ht  (1.6 m)  Wt 213 lb 1 oz (96.645 kg)  BMI 37.75 kg/m2  SpO2 96%   General Medical Exam:   General:  Well appearing, comfortable.   Eyes/ENT: see cranial nerve examination.  Edentulous  Neck: No masses appreciated.  Full range of motion without tenderness.  No carotid bruits. Respiratory:  Clear to auscultation, good air entry bilaterally.   Cardiac:  Regular rate and rhythm, no murmur.   Extremities:  Right heel with swelling of the Achilles tendon.  Skin:  No rashes or lesions.  Neurological Exam: MENTAL STATUS including orientation to time, place, person, recent and remote memory,  attention span and concentration, language, and fund of knowledge is normal.  Speech is not dysarthric.  CRANIAL NERVES: II:  No visual field defects.  Unremarkable fundi.   III-IV-VI: Pupils equal round and reactive to light.  Normal conjugate, extra-ocular eye movements in all directions of gaze.  No nystagmus.  No ptosis.   V:  Normal facial sensation.   VII:  Normal facial symmetry and movements.    VIII:  Normal hearing and vestibular function.   IX-X:  Normal palatal movement.   XI:  Normal shoulder shrug and head rotation.   XII:  Normal tongue strength and range of motion, no deviation or fasciculation.  MOTOR:  No atrophy, fasciculations or abnormal movements.  No pronator drift.  Tone is normal.  There is tenderness to palpation over her proximal muscles.   Right Upper Extremity:    Left Upper Extremity:    Deltoid  5/5   Deltoid  5/5   Biceps  5/5   Biceps  5/5   Triceps  5/5   Triceps  5/5   Wrist extensors  5/5   Wrist extensors  5/5   Wrist flexors  5/5   Wrist flexors  5/5   Finger extensors  5/5   Finger extensors  5/5   Finger flexors  5/5   Finger flexors  5/5   Dorsal interossei  5/5   Dorsal interossei  5/5   Abductor pollicis  5/5   Abductor pollicis  5/5   Tone (Ashworth scale)  0  Tone (Ashworth scale)  0   Right Lower Extremity:    Left Lower Extremity:    Hip flexors  5/5   Hip flexors  5/5   Hip extensors  5/5   Hip extensors  5/5   Knee flexors  5/5   Knee flexors  5/5   Knee extensors  5/5   Knee extensors  5/5   Dorsiflexors  5/5   Dorsiflexors  5/5   Plantarflexors  5/5   Plantarflexors  5/5   Toe extensors  5/5   Toe extensors  5/5   Toe flexors  5/5   Toe flexors  5/5   Tone (Ashworth scale)  0  Tone (Ashworth scale)  0   MSRs:  Right                                                                 Left brachioradialis 2+  brachioradialis 2+  biceps 2+  biceps 2+  triceps 2+  triceps 2+  patellar 2+  patellar 2+  ankle jerk 2+  ankle jerk 2+    Hoffman no  Hoffman no  plantar response down  plantar response down   SENSORY:  Normal and symmetric perception of light touch, pinprick, vibration, and proprioception.   COORDINATION/GAIT: Normal finger-to- nose-finger and heel-to-shin.  Intact rapid alternating movements bilaterally.  Able to rise from a chair without using arms.  Antalgic gait due to right heel pain.      IMPRESSION: Ms. Willetts is a 55 year-old female referred for evaluation of bilateral leg pain.  Her neurological exam is non-focal.  She has right heel pain and swelling causing antalgic gait, but there is no sensory or motor deficits appreciated.  The generalized nature of her leg pain makes it difficult to localize to a specific nerve root or peripheral nerve distribution and raises the possibility of a more widespread pain syndrome, such a fibromyalgia.  She does endorse myalgia and has tenderness on exam.  For completeness, she will have NCS/EMG of the legs to to be sure a lumbar radiculopathy or neuropathy is not missed. She will have laboratory testing for muscle cramps and pain as noted below.Marland Kitchen     PLAN/RECOMMENDATIONS:  1.  Check vitamin B12, vitamin B1, CK, aldolase, copper 2.  NCS/EMG of the lower extremities 3.  Start tizanidine 2mg  at bedtime for muscle cramps.   4.  Encouraged to stay well hydrated  and start doing posterior leg stretches  Return to clinic in 4 months.   The duration of this appointment visit was 40 minutes of face-to-face time with the patient.  Greater than 50% of this time was spent in counseling, explanation of diagnosis, planning of further management, and coordination of care.   Thank you for allowing me to participate in patient's care.  If I can answer any additional questions, I would be pleased to do so.    Sincerely,    Donika K. Allena Katz, DO

## 2015-09-16 NOTE — Progress Notes (Signed)
   Subjective:    Patient ID: Annette Boyer, female    DOB: 12/01/1960, 55 y.o.   MRN: [id number]  HPI Chief Complaint  Patient presents with  . Foot Pain    Right foot; back of heel; pt stated, "feels a knot in back of heel"      Review of Systems  HENT: Positive for ear pain.   Eyes: Positive for itching.  Respiratory: Positive for shortness of breath.   Endocrine: Positive for heat intolerance.  Musculoskeletal: Positive for myalgias, arthralgias and gait problem.  Neurological: Positive for weakness, light-headedness, numbness and headaches.  Hematological: Bruises/bleeds easily.  Psychiatric/Behavioral: Positive for confusion. The patient is nervous/anxious.   All other systems reviewed and are negative.      Objective:   Physical Exam        Assessment & Plan:

## 2015-09-16 NOTE — Patient Instructions (Signed)

## 2015-09-17 ENCOUNTER — Telehealth: Payer: Self-pay | Admitting: Neurology

## 2015-09-17 NOTE — Telephone Encounter (Signed)
Annette Boyer 06-11-2060. She was prescribed a muscle relaxer and she said she called the pharmacy but that it was not there. She uses [facility] [phone]. The patient asked if we could let her know when it is called in. I can call her if you would like. Thank you

## 2015-09-17 NOTE — Telephone Encounter (Signed)
Informed patient that the pharmacy has to order the med and it will be ready tomorrow.

## 2015-09-18 LAB — ALDOLASE: Aldolase: 4.8 U/L (ref <=8.1)

## 2015-09-18 NOTE — Progress Notes (Signed)
Subjective:     Patient ID: Annette Boyer, female   DOB: 1960-11-26, 55 y.o.   MRN: [id number]  HPI patient points to the back of the right heel stating it's still been bothering her and that she had tried shoe gear modifications ice therapy but has trouble wearing shoes and she's working full time   Review of Systems  All other systems reviewed and are negative.      Objective:   Physical Exam  Constitutional: She is oriented to person, place, and time.  Cardiovascular: Intact distal pulses.   Musculoskeletal: Normal range of motion.  Neurological: She is oriented to person, place, and time.  Skin: Skin is warm.  Nursing note and vitals reviewed.  neurovascular status intact muscle strength adequate with posterior heel pain right lateral side insertion Achilles tendon     Assessment:     Achilles tendinitis right    Plan:     Reviewed condition and discussed careful injection explaining risk of injection and chances for rupture. She is willing to its she is willing to undergo the procedure and I went ahead today and I carefully injected the lateral side 3 mg Dexon some Kenalog 5 mill grams Xylocaine and advised on reduced activity

## 2015-09-19 LAB — COPPER, SERUM: Copper: 150 ug/dL (ref 72–166)

## 2015-09-23 LAB — VITAMIN B1: VITAMIN B1 (THIAMINE): 12 nmol/L (ref 8–30)

## 2015-09-25 ENCOUNTER — Ambulatory Visit (INDEPENDENT_AMBULATORY_CARE_PROVIDER_SITE_OTHER): Payer: No Typology Code available for payment source | Admitting: Neurology

## 2015-09-25 ENCOUNTER — Ambulatory Visit: Payer: No Typology Code available for payment source | Attending: Family Medicine

## 2015-09-25 DIAGNOSIS — E538 Deficiency of other specified B group vitamins: Secondary | ICD-10-CM

## 2015-09-25 MED ORDER — CYANOCOBALAMIN 1000 MCG/ML IJ SOLN
1000.0000 ug | Freq: Once | INTRAMUSCULAR | Status: AC
  Administered 2015-09-25: 1000 ug via INTRAMUSCULAR

## 2015-09-25 MED FILL — QUETIAPINE FUMARATE 50 MG T: 50 | 30 days supply | Qty: 30 | Fill #0 | Status: TO

## 2015-09-25 MED FILL — MIRTAZAPINE 15 MG TABLET: 15 | 30 days supply | Qty: 30 | Fill #0

## 2015-09-25 NOTE — Progress Notes (Signed)
B12 injection to left deltoid with no apparent complications.   

## 2015-09-26 ENCOUNTER — Ambulatory Visit (INDEPENDENT_AMBULATORY_CARE_PROVIDER_SITE_OTHER): Payer: No Typology Code available for payment source | Admitting: *Deleted

## 2015-09-26 DIAGNOSIS — E538 Deficiency of other specified B group vitamins: Secondary | ICD-10-CM

## 2015-09-26 MED ORDER — CYANOCOBALAMIN 1000 MCG/ML IJ SOLN
1000.0000 ug | Freq: Once | INTRAMUSCULAR | Status: AC
  Administered 2015-09-26: 1000 ug via INTRAMUSCULAR

## 2015-09-26 NOTE — Progress Notes (Signed)
Patient in for B12 injection. 

## 2015-09-27 ENCOUNTER — Ambulatory Visit (INDEPENDENT_AMBULATORY_CARE_PROVIDER_SITE_OTHER): Payer: No Typology Code available for payment source | Admitting: *Deleted

## 2015-09-27 DIAGNOSIS — E538 Deficiency of other specified B group vitamins: Secondary | ICD-10-CM

## 2015-09-27 MED ORDER — CYANOCOBALAMIN 1000 MCG/ML IJ SOLN
1000.0000 ug | Freq: Once | INTRAMUSCULAR | Status: AC
  Administered 2015-09-27: 1000 ug via INTRAMUSCULAR

## 2015-09-27 NOTE — Progress Notes (Signed)
Patient in for B12 injection. 

## 2015-10-01 ENCOUNTER — Ambulatory Visit (INDEPENDENT_AMBULATORY_CARE_PROVIDER_SITE_OTHER): Payer: No Typology Code available for payment source | Admitting: Neurology

## 2015-10-01 ENCOUNTER — Ambulatory Visit (INDEPENDENT_AMBULATORY_CARE_PROVIDER_SITE_OTHER): Payer: No Typology Code available for payment source | Admitting: *Deleted

## 2015-10-01 DIAGNOSIS — E538 Deficiency of other specified B group vitamins: Secondary | ICD-10-CM

## 2015-10-01 DIAGNOSIS — R202 Paresthesia of skin: Secondary | ICD-10-CM

## 2015-10-01 DIAGNOSIS — R252 Cramp and spasm: Secondary | ICD-10-CM

## 2015-10-01 DIAGNOSIS — M791 Myalgia, unspecified site: Secondary | ICD-10-CM

## 2015-10-01 MED ORDER — CYANOCOBALAMIN 1000 MCG/ML IJ SOLN
1000.0000 ug | Freq: Once | INTRAMUSCULAR | Status: AC
  Administered 2015-10-01: 1000 ug via INTRAMUSCULAR

## 2015-10-01 MED FILL — ?LEVOTHYROXINE 100 MCG TAB: 100 | 30 days supply | Qty: 30 | Fill #1 | Status: TO

## 2015-10-01 NOTE — Progress Notes (Signed)
Patient in for B12 injection. 

## 2015-10-01 NOTE — Procedures (Signed)
[facility]  [address], [address]  [place], [place] [address] Tel: [phone] Fax:  [phone] Test Date:  10/01/2015  Patient: Annette Boyer DOB: 07/07/1960 Physician: Nita Sickleonika Turki Tapanes, DO  Sex: Female Height: 5\' 3"  Ref Phys: Nita Sickleonika Gwendloyn Forsee, DO  ID#: [id number] Temp: 33.7C Technician: Judie PetitM. Dean   Patient Complaints: This is a 55 year old female referred for evaluation of pain, numbness and tingling in feet and legs.  NCV & EMG Findings: Extensive electrodiagnostic testing of the left lower extremity and additional studies of the right shows: 1. Bilateral sural and superficial peroneal sensory responses are within normal limits. 2. Bilateral peroneal and tibial motor responses are within normal limits. 3. Bilateral tibial H reflex studies are within normal limits. 4. There is no evidence of active or chronic motor axonal loss changes affecting any of the tested muscles. Motor unit configuration and recruitment pattern is within normal limits.  Impression: This is a normal study of the lower extremities. In particular, there is no evidence of a generalized sensorimotor polyneuropathy or lumbosacral radiculopathy.   ___________________________ Nita Sickleonika Sari Cogan, DO    Nerve Conduction Studies Anti Sensory Summary Table   Site NR Peak (ms) Norm Peak (ms) P-T Amp (V) Norm P-T Amp  Left Sup Peroneal Anti Sensory (Ant Lat Mall)  33.7C  12 cm    3.0 <4.6 10.7 >4  Right Sup Peroneal Anti Sensory (Ant Lat Mall)  33.7C  12 cm    3.3 <4.6 7.2 >4  Left Sural Anti Sensory (Lat Mall)  33.7C  Calf    2.8 <4.6 12.4 >4  Right Sural Anti Sensory (Lat Mall)  33.7C  Calf    3.1 <4.6 9.9 >4   Motor Summary Table   Site NR Onset (ms) Norm Onset (ms) O-P Amp (mV) Norm O-P Amp Site1 Site2 Delta-0 (ms) Dist (cm) Vel (m/s) Norm Vel (m/s)  Left Peroneal Motor (Ext Dig Brev)  33.7C  Ankle    2.7 <6.0 3.8 >2.5 B Fib Ankle 6.4 30.0 47 >40  B Fib    9.1  3.3  Poplt B Fib 2.0 10.0 50  >40  Poplt    11.1  3.2         Right Peroneal Motor (Ext Dig Brev)  33.7C  Ankle    3.3 <6.0 2.8 >2.5 B Fib Ankle 6.6 30.0 45 >40  B Fib    9.9  2.4  Poplt B Fib 1.8 10.0 56 >40  Poplt    11.7  2.4         Left Tibial Motor (Abd Hall Brev)  33.7C  Ankle    2.7 <6.0 5.7 >4 Knee Ankle 8.2 38.0 46 >40  Knee    10.9  5.0         Right Tibial Motor (Abd Hall Brev)  33.7C  Ankle    2.4 <6.0 8.8 >4 Knee Ankle 8.1 38.0 47 >40  Knee    10.5  4.7          H Reflex Studies   NR H-Lat (ms) Lat Norm (ms) L-R H-Lat (ms) M-Lat (ms) HLat-MLat (ms)  Left Tibial (Gastroc)  33.7C     29.12 <35 1.76 4.22 24.90  Right Tibial (Gastroc)  33.7C     30.88 <35 1.76 4.22 26.66   EMG   Side Muscle Ins Act Fibs Psw Fasc Number Recrt Dur Dur. Amp Amp. Poly Poly. Comment  Left AntTibialis Nml Nml Nml Nml Nml Nml Nml Nml Nml Nml Nml Nml  N/A  Left Gastroc Nml Nml Nml Nml Nml Nml Nml Nml Nml Nml Nml Nml N/A  Left Flex Dig Long Nml Nml Nml Nml Nml Nml Nml Nml Nml Nml Nml Nml N/A  Left RectFemoris Nml Nml Nml Nml Nml Nml Nml Nml Nml Nml Nml Nml N/A  Left GluteusMed Nml Nml Nml Nml Nml Nml Nml Nml Nml Nml Nml Nml N/A  Left BicepsFemS Nml Nml Nml Nml Nml Nml Nml Nml Nml Nml Nml Nml N/A  Right AntTibialis Nml Nml Nml Nml Nml Nml Nml Nml Nml Nml Nml Nml N/A  Right Gastroc Nml Nml Nml Nml Nml Nml Nml Nml Nml Nml Nml Nml N/A  Right RectFemoris Nml Nml Nml Nml Nml Nml Nml Nml Nml Nml Nml Nml N/A      Waveforms:

## 2015-10-02 ENCOUNTER — Ambulatory Visit (INDEPENDENT_AMBULATORY_CARE_PROVIDER_SITE_OTHER): Payer: No Typology Code available for payment source | Admitting: *Deleted

## 2015-10-02 DIAGNOSIS — E538 Deficiency of other specified B group vitamins: Secondary | ICD-10-CM

## 2015-10-02 MED ORDER — CYANOCOBALAMIN 1000 MCG/ML IJ SOLN
1000.0000 ug | Freq: Once | INTRAMUSCULAR | Status: AC
  Administered 2015-10-02: 1000 ug via INTRAMUSCULAR

## 2015-10-02 NOTE — Progress Notes (Signed)
Patient in for B12 injection. 

## 2015-10-03 ENCOUNTER — Ambulatory Visit (INDEPENDENT_AMBULATORY_CARE_PROVIDER_SITE_OTHER): Payer: No Typology Code available for payment source

## 2015-10-03 DIAGNOSIS — E538 Deficiency of other specified B group vitamins: Secondary | ICD-10-CM

## 2015-10-03 MED ORDER — CYANOCOBALAMIN 1000 MCG/ML IJ SOLN
1000.0000 ug | Freq: Once | INTRAMUSCULAR | Status: AC
  Administered 2015-10-03: 1000 ug via INTRAMUSCULAR

## 2015-10-03 NOTE — Progress Notes (Signed)
Pt presented to clinic for B12 injection. Administered in left deltoid. Tolerated well. Will set up next  injection prior to leaving.

## 2015-10-09 MED FILL — tiZANidine HCL 2 MG TABS: 2 | 30 days supply | Qty: 30 | Fill #1

## 2015-10-09 MED FILL — GABAPENTIN 300 MG CAPSULE: 300 | 30 days supply | Qty: 60 | Fill #2

## 2015-10-10 ENCOUNTER — Ambulatory Visit: Payer: No Typology Code available for payment source | Admitting: Podiatry

## 2015-10-10 ENCOUNTER — Encounter: Payer: Self-pay | Admitting: Podiatry

## 2015-10-10 ENCOUNTER — Other Ambulatory Visit: Payer: Self-pay | Admitting: *Deleted

## 2015-10-10 ENCOUNTER — Telehealth: Payer: Self-pay | Admitting: Family Medicine

## 2015-10-10 ENCOUNTER — Ambulatory Visit (INDEPENDENT_AMBULATORY_CARE_PROVIDER_SITE_OTHER): Payer: No Typology Code available for payment source | Admitting: *Deleted

## 2015-10-10 VITALS — BP 143/89 | HR 77 | Resp 16

## 2015-10-10 DIAGNOSIS — E538 Deficiency of other specified B group vitamins: Secondary | ICD-10-CM

## 2015-10-10 DIAGNOSIS — M7661 Achilles tendinitis, right leg: Secondary | ICD-10-CM

## 2015-10-10 MED ORDER — CYANOCOBALAMIN 1000 MCG/ML IJ SOLN: 1000.0000 ug | Freq: Once | INTRAMUSCULAR | Status: AC

## 2015-10-10 MED ORDER — "SYRINGE 23G X 1"" 3 ML MISC": 1.0000 | Freq: Once | Status: AC

## 2015-10-10 MED ORDER — CYANOCOBALAMIN 1000 MCG/ML IJ SOLN
1000.0000 ug | Freq: Once | INTRAMUSCULAR | Status: AC
  Administered 2015-10-10: 1000 ug via INTRAMUSCULAR

## 2015-10-10 NOTE — Progress Notes (Signed)
Patient in for B12 injection. 

## 2015-10-10 NOTE — Telephone Encounter (Signed)
Pt. Called stating that about 4 months ago she received a cortisone shot on her knee  And she would like to know to know is she can receive another one because she in in pain.  Please f/u with pt.

## 2015-10-11 NOTE — Progress Notes (Signed)
Subjective:     Patient ID: Annette Boyer, female   DOB: [birth year], 55 y.o.   MRN: [id number]  HPI patient presents with significant pain in the posterior aspect of the right heel that has shown some improvement but is still present   Review of Systems     Objective:   Physical Exam Neurovascular status intact with pain in the posterior aspect of the right heel that's painful    Assessment:     Continued Achilles tendinitis right    Plan:     Air fracture walker dispensed with instructions and will be seen back as needed

## 2015-10-23 NOTE — Telephone Encounter (Signed)
Return pt call  Pt stated move to [place] No longer our Pt

## 2015-11-08 ENCOUNTER — Ambulatory Visit: Payer: No Typology Code available for payment source | Admitting: Podiatry

## 2015-12-19 ENCOUNTER — Ambulatory Visit: Payer: No Typology Code available for payment source | Admitting: Neurology

## 2017-03-29 IMAGING — CR DG KNEE STANDING AP BILAT
1 series · 1 of 1 positions shown · non-contrast
Comparison: None.

CLINICAL DATA: Chronic bilateral knee pain. Evaluate for rheumatoid
arthritis. Initial encounter.

EXAM:
BILATERAL KNEES STANDING - 1 VIEW

[knee ap bilat standing]
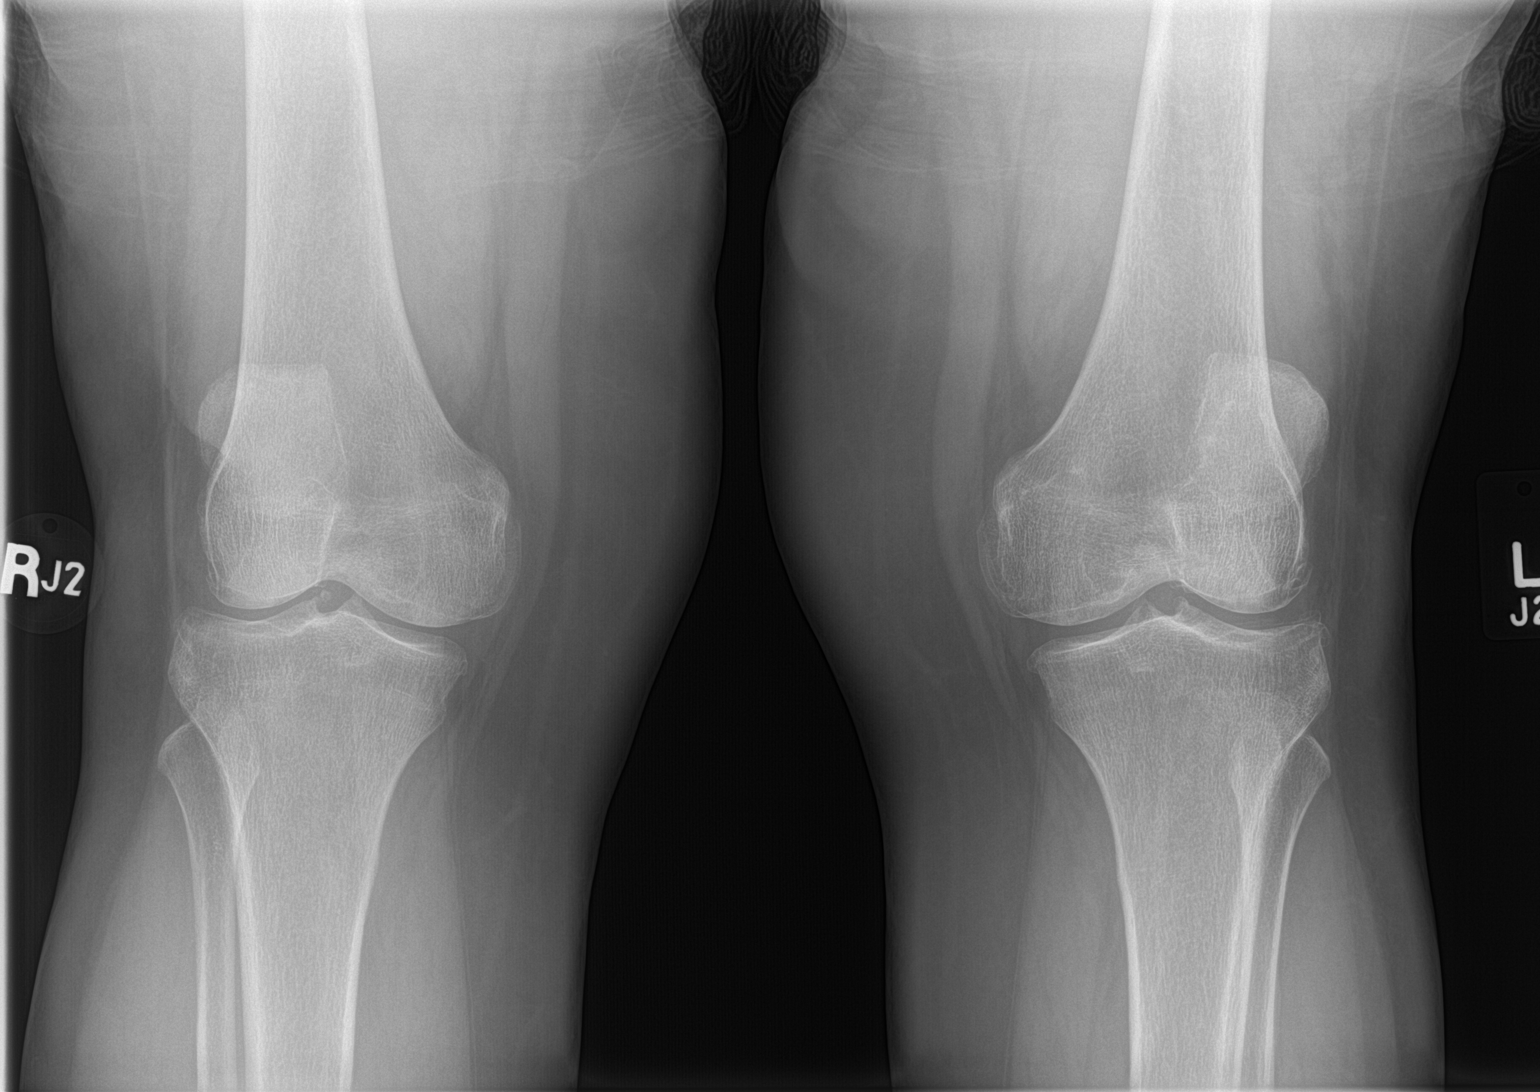

[1 of 1 positions shown; findings below may reference images not displayed]

FINDINGS: The bones appear mildly demineralized. There is mild medial and
lateral compartment joint space loss with osteophytes and both
knees. The alignment is normal. No erosive changes or acute osseous
findings demonstrated.
IMPRESSION: Mild medial and lateral compartment joint space loss and osteophytes
in both knees, most consistent with mild osteoarthritis. No erosive
changes identified.

## 2017-05-27 ENCOUNTER — Encounter: Payer: Self-pay | Admitting: *Deleted
